# Patient Record
Sex: Female | Born: 1960 | Race: White | Hispanic: No | Marital: Married | Smoking: Never smoker
Health system: Southern US, Community
[De-identification: ages and names within clinical notes are randomized; demographics above are authoritative.]

## PROBLEM LIST (undated history)

## (undated) DIAGNOSIS — I10 Essential (primary) hypertension: Secondary | ICD-10-CM

## (undated) DIAGNOSIS — T481X5A Adverse effect of skeletal muscle relaxants [neuromuscular blocking agents], initial encounter: Secondary | ICD-10-CM

## (undated) DIAGNOSIS — F419 Anxiety disorder, unspecified: Secondary | ICD-10-CM

## (undated) DIAGNOSIS — F40240 Claustrophobia: Secondary | ICD-10-CM

## (undated) DIAGNOSIS — Z8489 Family history of other specified conditions: Secondary | ICD-10-CM

## (undated) DIAGNOSIS — Z872 Personal history of diseases of the skin and subcutaneous tissue: Secondary | ICD-10-CM

## (undated) DIAGNOSIS — T8859XA Other complications of anesthesia, initial encounter: Secondary | ICD-10-CM

## (undated) DIAGNOSIS — M8580 Other specified disorders of bone density and structure, unspecified site: Secondary | ICD-10-CM

## (undated) DIAGNOSIS — M1990 Unspecified osteoarthritis, unspecified site: Secondary | ICD-10-CM

## (undated) DIAGNOSIS — T4145XA Adverse effect of unspecified anesthetic, initial encounter: Secondary | ICD-10-CM

## (undated) DIAGNOSIS — N6092 Unspecified benign mammary dysplasia of left breast: Secondary | ICD-10-CM

## (undated) HISTORY — PX: LASIK: SHX215

## (undated) HISTORY — PX: ENDOMETRIAL ABLATION: SHX621

## (undated) HISTORY — PX: BREAST BIOPSY: SHX20

## (undated) HISTORY — PX: WISDOM TOOTH EXTRACTION: SHX21

## (undated) HISTORY — PX: CATARACT EXTRACTION: SUR2

## (undated) HISTORY — PX: OTHER SURGICAL HISTORY: SHX169

---

## 1993-09-12 HISTORY — PX: RHINOPLASTY: SUR1284

## 2005-02-22 ENCOUNTER — Ambulatory Visit: Payer: Self-pay | Admitting: Unknown Physician Specialty

## 2005-03-07 ENCOUNTER — Ambulatory Visit: Payer: Self-pay | Admitting: Unknown Physician Specialty

## 2005-08-19 ENCOUNTER — Ambulatory Visit: Payer: Self-pay | Admitting: Unknown Physician Specialty

## 2006-03-22 ENCOUNTER — Ambulatory Visit: Payer: Self-pay | Admitting: Unknown Physician Specialty

## 2007-04-16 ENCOUNTER — Ambulatory Visit: Payer: Self-pay | Admitting: General Surgery

## 2007-04-18 ENCOUNTER — Ambulatory Visit: Payer: Self-pay | Admitting: General Surgery

## 2008-04-21 ENCOUNTER — Ambulatory Visit: Payer: Self-pay | Admitting: Unknown Physician Specialty

## 2009-04-22 ENCOUNTER — Ambulatory Visit: Payer: Self-pay | Admitting: Unknown Physician Specialty

## 2009-05-26 ENCOUNTER — Ambulatory Visit: Payer: Self-pay | Admitting: Unknown Physician Specialty

## 2010-01-05 ENCOUNTER — Ambulatory Visit: Payer: Self-pay | Admitting: General Surgery

## 2010-04-30 ENCOUNTER — Ambulatory Visit: Payer: Self-pay | Admitting: Unknown Physician Specialty

## 2010-05-03 LAB — PATHOLOGY REPORT

## 2011-01-05 ENCOUNTER — Ambulatory Visit: Payer: Self-pay | Admitting: Unknown Physician Specialty

## 2011-07-13 ENCOUNTER — Ambulatory Visit: Payer: Self-pay | Admitting: Unknown Physician Specialty

## 2011-07-15 LAB — PATHOLOGY REPORT

## 2014-08-01 ENCOUNTER — Ambulatory Visit: Payer: Self-pay | Admitting: Unknown Physician Specialty

## 2014-08-01 LAB — HM COLONOSCOPY

## 2015-05-27 ENCOUNTER — Other Ambulatory Visit: Payer: Self-pay | Admitting: Family Medicine

## 2015-06-22 ENCOUNTER — Other Ambulatory Visit: Payer: Self-pay | Admitting: Family Medicine

## 2015-08-07 ENCOUNTER — Telehealth: Payer: Self-pay

## 2015-08-07 DIAGNOSIS — J0140 Acute pansinusitis, unspecified: Secondary | ICD-10-CM

## 2015-08-07 MED ORDER — AZITHROMYCIN 250 MG PO TABS
ORAL_TABLET | ORAL | Status: DC
Start: 2015-08-07 — End: 2017-06-23

## 2015-08-07 NOTE — Telephone Encounter (Signed)
zpak sent in to [facility]

## 2015-08-07 NOTE — Telephone Encounter (Signed)
Pt advised on her voicemail-aa 

## 2015-08-07 NOTE — Telephone Encounter (Signed)
Patient calling and states she is having congestion, cough, blowing out and coughing up yellow phlegm, sinus pain/pressure around her eyes, no fever or chills. Can she get something called in? She is going out of town next Thursday. No appts left today. Thank you-aa

## 2015-10-12 ENCOUNTER — Other Ambulatory Visit: Payer: Self-pay | Admitting: Family Medicine

## 2015-10-12 DIAGNOSIS — B001 Herpesviral vesicular dermatitis: Secondary | ICD-10-CM

## 2015-10-12 DIAGNOSIS — J309 Allergic rhinitis, unspecified: Secondary | ICD-10-CM

## 2015-10-12 MED ORDER — LEVOCETIRIZINE DIHYDROCHLORIDE 5 MG PO TABS: 5.0000 mg | ORAL_TABLET | Freq: Every day | ORAL | Status: DC

## 2015-10-12 MED ORDER — VALACYCLOVIR HCL 1 G PO TABS: ORAL_TABLET | ORAL | Status: DC

## 2016-04-27 ENCOUNTER — Other Ambulatory Visit: Payer: Self-pay | Admitting: Obstetrics and Gynecology

## 2016-04-27 DIAGNOSIS — R928 Other abnormal and inconclusive findings on diagnostic imaging of breast: Secondary | ICD-10-CM

## 2016-04-29 ENCOUNTER — Ambulatory Visit
Admission: RE | Admit: 2016-04-29 | Discharge: 2016-04-29 | Disposition: A | Discharge status: Home / Self Care | Source: Ambulatory Visit | Attending: Obstetrics and Gynecology | Admitting: Obstetrics and Gynecology

## 2016-04-29 DIAGNOSIS — R928 Other abnormal and inconclusive findings on diagnostic imaging of breast: Secondary | ICD-10-CM

## 2016-06-06 ENCOUNTER — Other Ambulatory Visit: Payer: Self-pay | Admitting: Family Medicine

## 2016-07-19 ENCOUNTER — Other Ambulatory Visit: Payer: Self-pay | Admitting: Family Medicine

## 2016-07-19 DIAGNOSIS — B001 Herpesviral vesicular dermatitis: Secondary | ICD-10-CM

## 2016-09-02 ENCOUNTER — Other Ambulatory Visit: Payer: Self-pay | Admitting: Family Medicine

## 2016-09-21 HISTORY — PX: OTHER SURGICAL HISTORY: SHX169

## 2016-10-11 ENCOUNTER — Other Ambulatory Visit: Payer: Self-pay | Admitting: Family Medicine

## 2016-10-11 DIAGNOSIS — J309 Allergic rhinitis, unspecified: Secondary | ICD-10-CM

## 2016-11-17 ENCOUNTER — Other Ambulatory Visit: Payer: Self-pay | Admitting: Family Medicine

## 2016-11-17 ENCOUNTER — Telehealth: Payer: Self-pay

## 2016-11-17 MED ORDER — CETIRIZINE HCL 10 MG PO TABS: 10.0000 mg | ORAL_TABLET | Freq: Every day | ORAL | 3 refills | Status: DC

## 2016-11-17 NOTE — Telephone Encounter (Signed)
Pt called because her pharmacy informed her that they cannot prescribe her xyzal because it is now OTC. Pt is requesting a prescription for a new antihistamine, and was informed that cetirizine can be prescribed and is covered by her insurance. [facility] [address]. Allene Dillon.Beyonca Wisz Drozdowski, CMA

## 2016-11-17 NOTE — Telephone Encounter (Signed)
[facility]

## 2016-11-28 ENCOUNTER — Other Ambulatory Visit: Payer: Self-pay | Admitting: Family Medicine

## 2017-05-09 ENCOUNTER — Other Ambulatory Visit: Payer: Self-pay | Admitting: Obstetrics and Gynecology

## 2017-05-09 DIAGNOSIS — R928 Other abnormal and inconclusive findings on diagnostic imaging of breast: Secondary | ICD-10-CM

## 2017-06-02 ENCOUNTER — Ambulatory Visit
Admission: RE | Admit: 2017-06-02 | Discharge: 2017-06-02 | Disposition: A | Discharge status: Home / Self Care | Source: Ambulatory Visit | Attending: Obstetrics and Gynecology | Admitting: Obstetrics and Gynecology

## 2017-06-02 DIAGNOSIS — R928 Other abnormal and inconclusive findings on diagnostic imaging of breast: Secondary | ICD-10-CM

## 2017-06-23 ENCOUNTER — Ambulatory Visit (INDEPENDENT_AMBULATORY_CARE_PROVIDER_SITE_OTHER): Admitting: Family Medicine

## 2017-06-23 ENCOUNTER — Encounter: Payer: Self-pay | Admitting: Family Medicine

## 2017-06-23 VITALS — BP 112/80 | HR 70 | Temp 97.8°F | Resp 16 | Wt 141.0 lb

## 2017-06-23 DIAGNOSIS — L719 Rosacea, unspecified: Secondary | ICD-10-CM | POA: Insufficient documentation

## 2017-06-23 DIAGNOSIS — I10 Essential (primary) hypertension: Secondary | ICD-10-CM | POA: Diagnosis not present

## 2017-06-23 DIAGNOSIS — Z13220 Encounter for screening for lipoid disorders: Secondary | ICD-10-CM

## 2017-06-23 DIAGNOSIS — J301 Allergic rhinitis due to pollen: Secondary | ICD-10-CM | POA: Insufficient documentation

## 2017-06-23 LAB — LIPID PANEL
CHOL/HDL RATIO: 2.4 (calc) (ref <5.0)
CHOLESTEROL: 170 mg/dL (ref <200)
HDL: 71 mg/dL (ref >50)
LDL CHOLESTEROL (CALC): 83 mg/dL
NON-HDL CHOLESTEROL (CALC): 99 mg/dL (ref <130)
TRIGLYCERIDES: 82 mg/dL (ref <150)

## 2017-06-23 LAB — COMPLETE METABOLIC PANEL WITH GFR
AG Ratio: 1.7 (calc) (ref 1.0–2.5)
ALT: 11 U/L (ref 6–29)
AST: 17 U/L (ref 10–35)
Albumin: 4.3 g/dL (ref 3.6–5.1)
Alkaline phosphatase (APISO): 53 U/L (ref 33–130)
BUN: 15 mg/dL (ref 7–25)
CALCIUM: 9.2 mg/dL (ref 8.6–10.4)
CO2: 32 mmol/L (ref 20–32)
CREATININE: 0.73 mg/dL (ref 0.50–1.05)
Chloride: 100 mmol/L (ref 98–110)
GFR, EST NON AFRICAN AMERICAN: 93 mL/min/{1.73_m2} (ref >=60)
GFR, Est African American: 108 mL/min/{1.73_m2} (ref >=60)
GLOBULIN: 2.6 g/dL (ref 1.9–3.7)
Glucose, Bld: 87 mg/dL (ref 65–99)
Potassium: 4 mmol/L (ref 3.5–5.3)
SODIUM: 138 mmol/L (ref 135–146)
Total Bilirubin: 0.7 mg/dL (ref 0.2–1.2)
Total Protein: 6.9 g/dL (ref 6.1–8.1)

## 2017-06-23 NOTE — Progress Notes (Signed)
Subjective:     Patient ID: Jill Robbins, female   DOB: [birth year], 56 y.o.   MRN: [id number]  HPI  Chief Complaint  Patient presents with  . Hypertension  . Allergic Rhinitis   States she has been keeping up with her health maintenance and her gyn has been refilling her bp medication. Receiving laser treatments for rosacea at [facility]. Wishes rx for Shingrix vaccine.   Review of Systems  Respiratory: Negative for chest tightness and shortness of breath.   Cardiovascular: Negative for chest pain and palpitations.  Neurological: Negative for dizziness.       Objective:   Physical Exam  Constitutional: She appears well-developed and well-nourished. No distress.  Cardiovascular: Normal rate and regular rhythm.   Pulmonary/Chest: Breath sounds normal.  Musculoskeletal: She exhibits no edema (of lower extremities).       Assessment:    1. Essential hypertension: continue dyazide  - COMPLETE METABOLIC PANEL WITH GFR  2. Allergic rhinitis due to pollen, unspecified seasonality: continue cetirizine  3. Screening for cholesterol level - Lipid panel    Plan:    Rx for Shingrix vaccine. Further f/u pending lab work.

## 2017-06-23 NOTE — Patient Instructions (Signed)
We will call you with the lab results. 

## 2017-06-24 ENCOUNTER — Telehealth: Payer: Self-pay

## 2017-06-24 NOTE — Telephone Encounter (Signed)
-----   Message from Anola Gurney, [place] sent at 06/23/2017  4:31 PM EDT ----- Labs are excellent/ Please provide copy for the patient

## 2017-06-24 NOTE — Telephone Encounter (Signed)
[id number] 

## 2017-06-27 NOTE — Telephone Encounter (Signed)
Pt advised-Josi Roediger V Priseis Cratty, RMA  

## 2017-07-07 ENCOUNTER — Encounter: Payer: Self-pay | Admitting: Family Medicine

## 2017-07-17 ENCOUNTER — Other Ambulatory Visit: Payer: Self-pay | Admitting: Family Medicine

## 2017-07-17 DIAGNOSIS — B001 Herpesviral vesicular dermatitis: Secondary | ICD-10-CM

## 2017-07-18 ENCOUNTER — Encounter: Payer: Self-pay | Admitting: Family Medicine

## 2017-07-18 DIAGNOSIS — B001 Herpesviral vesicular dermatitis: Secondary | ICD-10-CM | POA: Insufficient documentation

## 2017-08-25 ENCOUNTER — Encounter: Payer: Self-pay | Admitting: Family Medicine

## 2017-08-25 ENCOUNTER — Ambulatory Visit (INDEPENDENT_AMBULATORY_CARE_PROVIDER_SITE_OTHER): Admitting: Family Medicine

## 2017-08-25 VITALS — BP 132/84 | HR 80 | Temp 98.2°F | Resp 16 | Wt 143.4 lb

## 2017-08-25 DIAGNOSIS — S6991XA Unspecified injury of right wrist, hand and finger(s), initial encounter: Secondary | ICD-10-CM | POA: Diagnosis not present

## 2017-08-25 DIAGNOSIS — Z23 Encounter for immunization: Secondary | ICD-10-CM

## 2017-08-25 NOTE — Progress Notes (Signed)
Subjective:     Patient ID: Jill Robbins, female   DOB: 07/13/1961, 56 y.o.   MRN: [id number] Chief Complaint  Patient presents with  . Finger Injury    Patient reports that two days ago she accidently slammed car door shut on her right thumb, patient states that she has been icing thumb and had taken Ibuprofen 800mg  for relief but is still has tingling and throbbing in finger.    HPI   Review of Systems     Objective:   Physical Exam  Constitutional: She appears well-developed and well-nourished. She appears distressed (due to pain).  Skin:  Right thumb with mild swelling. Thumbnail with sub-ungual hematoma. Attempt at burr hole with 18 gauge needle not tolerated by the patient.  Psychiatric:  anxious       Assessment:    1. Injury to fingernail, right, initial encounter: subungual hematoma  2. Need for shingles vaccine - Varicella-zoster vaccine IM    Plan:   Continue icing, ibuprofen, and elevation. Discussed that it will be at least a week for it to start feeling better. Monitor for nail lysis.

## 2017-08-25 NOTE — Patient Instructions (Signed)
Continue icing and taking ibuprofen. It takes a week or so for the swelling and pain to dissipate. Expect it to take 3 months for the nail to grow out.

## 2017-09-07 NOTE — Patient Instructions (Addendum)
Your procedure is scheduled on: Friday September 22, 2017 at 7:30 am  Enter through the Main Entrance of [facility] at: 6:00 am  Pick up the phone at the desk and dial [phone].  Call this number if you have problems the morning of surgery: [phone].  Remember: Do NOT eat food or drink any liquids after: Midnight Thursday January 10  Take these medicines the morning of surgery with a SIP OF WATER: none  STOP ALL VITAMINS AND SUPPLEMENTS 1 WEEK PRIOR TO SURGERY  DO NOT SMOKE DAY OF SURGERY  Do NOT wear jewelry (body piercing), metal hair clips/bobby pins, make-up, or nail polish. Do NOT wear lotions, powders, or perfumes.  You may wear deoderant. Do NOT shave for 48 hours prior to surgery. Do NOT bring valuables to the hospital. Contacts, dentures, or bridgework may not be worn into surgery.  Have a responsible adult drive you home and stay with you for 24 hours after your procedure

## 2017-09-13 ENCOUNTER — Encounter (HOSPITAL_COMMUNITY): Payer: Self-pay | Admitting: Obstetrics and Gynecology

## 2017-09-13 NOTE — H&P (Addendum)
Jill Robbins is an 57 y.o. female. Presenting with labial hypertrophy for labial reduction  Pertinent Gynecological History: Menses: post-menopausal Bleeding: N/A Contraception: vasectomy DES exposure: unknown Blood transfusions: none Sexually transmitted diseases: no past history Previous GYN Procedures: None  Last mammogram: abnormal: BIRADs3, 6 mo fu is planned Date: 2018 Last pap: normal Date: 2017 OB History: G0, P0   Menstrual History: No LMP recorded.    Past Medical History:  Diagnosis Date  . HTN (hypertension)   . Osteopenia     Past Surgical History:  Procedure Laterality Date  . ENDOMETRIAL ABLATION      No family history on file.  Social History:  reports that  has never smoked. she has never used smokeless tobacco. She reports that she drinks alcohol. She reports that she does not use drugs.  Allergies:  Allergies  Allergen Reactions  . Succinylcholine Other (See Comments)    Difficultly waking up  . Sulfa Antibiotics Rash  . Other     Mellon - lips swelling, ulcers inside the mouth  . Peanut-Containing Drug Products Itching    No medications prior to admission.    ROS  There were no vitals taken for this visit. Physical Exam  Gen: well appearing, NAD CV: Reg rate Pulm: NWOB Abd: soft, nondistended, nontender, no masses GYN: uterus WNL, no adnexa ttp/CMT, labial hypertrophy b/l with sig redundancy Ext: No edema b/l   No results found for this or any previous visit (from the past 24 hour(s)).  No results found.  Assessment/Plan: 57yo G0 presenting with labial hypertrophy for labial reduction. It is bothersome to the patient and would like it surgically corrected. Risks discussed including infection, bleeding, damage to the labia and surrounding structures, poor cosmesis, inability to resolve symptoms, and the need for additional procedures. Informed consent was given and patient signed consent.    Jill Robbins 09/13/2017, 2:02 PM    No updates. Again reviewed risks. Patient gave informed consent. Jill Robbins Latalia Etzler MD

## 2017-09-15 ENCOUNTER — Encounter (HOSPITAL_COMMUNITY): Payer: Self-pay

## 2017-09-15 ENCOUNTER — Encounter (HOSPITAL_COMMUNITY)
Admission: RE | Admit: 2017-09-15 | Discharge: 2017-09-15 | Disposition: A | Discharge status: Home / Self Care | Source: Ambulatory Visit | Attending: Obstetrics and Gynecology | Admitting: Obstetrics and Gynecology

## 2017-09-15 ENCOUNTER — Other Ambulatory Visit: Payer: Self-pay

## 2017-09-15 DIAGNOSIS — Z01812 Encounter for preprocedural laboratory examination: Secondary | ICD-10-CM | POA: Diagnosis present

## 2017-09-15 HISTORY — DX: Family history of other specified conditions: Z84.89

## 2017-09-15 HISTORY — DX: Other complications of anesthesia, initial encounter: T88.59XA

## 2017-09-15 HISTORY — DX: Adverse effect of unspecified anesthetic, initial encounter: T41.45XA

## 2017-09-15 LAB — CBC
HCT: 42 % (ref 36.0–46.0)
Hemoglobin: 14.5 g/dL (ref 12.0–15.0)
MCH: 32.2 pg (ref 26.0–34.0)
MCHC: 34.5 g/dL (ref 30.0–36.0)
MCV: 93.1 fL (ref 78.0–100.0)
PLATELETS: 290 10*3/uL (ref 150–400)
RBC: 4.51 MIL/uL (ref 3.87–5.11)
RDW: 12.5 % (ref 11.5–15.5)
WBC: 6.2 10*3/uL (ref 4.0–10.5)

## 2017-09-15 LAB — BASIC METABOLIC PANEL
Anion gap: 11 (ref 5–15)
BUN: 17 mg/dL (ref 6–20)
CO2: 28 mmol/L (ref 22–32)
Calcium: 9.8 mg/dL (ref 8.9–10.3)
Chloride: 97 mmol/L — ABNORMAL LOW (ref 101–111)
Creatinine, Ser: 0.67 mg/dL (ref 0.44–1.00)
GFR calc Af Amer: >60 mL/min (ref >60)
GFR calc non Af Amer: >60 mL/min (ref >60)
GLUCOSE: 86 mg/dL (ref 65–99)
POTASSIUM: 3.7 mmol/L (ref 3.5–5.1)
Sodium: 136 mmol/L (ref 135–145)

## 2017-09-15 LAB — TYPE AND SCREEN
ABO/RH(D): O POS
Antibody Screen: NEGATIVE

## 2017-09-15 LAB — ABO/RH: ABO/RH(D): O POS

## 2017-09-21 NOTE — Anesthesia Preprocedure Evaluation (Addendum)
Anesthesia Evaluation  Patient identified by MRN, date of birth, ID band Patient awake    Reviewed: Allergy & Precautions, NPO status , Patient's Chart, lab work & pertinent test results  History of Anesthesia Complications (+) PSEUDOCHOLINESTERASE DEFICIENCY, Family history of anesthesia reaction and history of anesthetic complications  Airway Mallampati: II  TM Distance: >3 FB Neck ROM: Full    Dental  (+) Dental Advisory Given   Pulmonary neg pulmonary ROS,    Pulmonary exam normal breath sounds clear to auscultation       Cardiovascular hypertension, Pt. on medications Normal cardiovascular exam Rhythm:Regular Rate:Normal     Neuro/Psych negative neurological ROS  negative psych ROS   GI/Hepatic negative GI ROS, Neg liver ROS,   Endo/Other  negative endocrine ROS  Renal/GU negative Renal ROS  negative genitourinary   Musculoskeletal negative musculoskeletal ROS (+)   Abdominal   Peds  Hematology negative hematology ROS (+)   Anesthesia Other Findings   Reproductive/Obstetrics                            Anesthesia Physical Anesthesia Plan  ASA: II  Anesthesia Plan: General   Post-op Pain Management:    Induction: Intravenous  PONV Risk Score and Plan: Propofol infusion and Treatment may vary due to age or medical condition  Airway Management Planned: LMA  Additional Equipment: None  Intra-op Plan:   Post-operative Plan:   Informed Consent: I have reviewed the patients History and Physical, chart, labs and discussed the procedure including the risks, benefits and alternatives for the proposed anesthesia with the patient or authorized representative who has indicated his/her understanding and acceptance.   Dental advisory given  Plan Discussed with: CRNA  Anesthesia Plan Comments:        Anesthesia Quick Evaluation

## 2017-09-22 ENCOUNTER — Encounter (HOSPITAL_COMMUNITY)
Admission: RE | Disposition: A | Discharge status: Home / Self Care | Payer: Self-pay | Source: Ambulatory Visit | Attending: Obstetrics and Gynecology

## 2017-09-22 ENCOUNTER — Ambulatory Visit (HOSPITAL_COMMUNITY): Admitting: Anesthesiology

## 2017-09-22 ENCOUNTER — Ambulatory Visit (HOSPITAL_COMMUNITY)
Admission: RE | Admit: 2017-09-22 | Discharge: 2017-09-22 | Disposition: A | Discharge status: Home / Self Care | Source: Ambulatory Visit | Attending: Obstetrics and Gynecology | Admitting: Obstetrics and Gynecology

## 2017-09-22 ENCOUNTER — Encounter (HOSPITAL_COMMUNITY): Payer: Self-pay

## 2017-09-22 DIAGNOSIS — I10 Essential (primary) hypertension: Secondary | ICD-10-CM | POA: Diagnosis not present

## 2017-09-22 DIAGNOSIS — N9069 Other specified hypertrophy of vulva: Secondary | ICD-10-CM | POA: Diagnosis present

## 2017-09-22 DIAGNOSIS — Z91010 Allergy to peanuts: Secondary | ICD-10-CM | POA: Diagnosis not present

## 2017-09-22 DIAGNOSIS — M8580 Other specified disorders of bone density and structure, unspecified site: Secondary | ICD-10-CM | POA: Diagnosis not present

## 2017-09-22 DIAGNOSIS — E8809 Other disorders of plasma-protein metabolism, not elsewhere classified: Secondary | ICD-10-CM | POA: Insufficient documentation

## 2017-09-22 DIAGNOSIS — Z79899 Other long term (current) drug therapy: Secondary | ICD-10-CM | POA: Diagnosis not present

## 2017-09-22 DIAGNOSIS — Z882 Allergy status to sulfonamides status: Secondary | ICD-10-CM | POA: Diagnosis not present

## 2017-09-22 DIAGNOSIS — Z888 Allergy status to other drugs, medicaments and biological substances status: Secondary | ICD-10-CM | POA: Insufficient documentation

## 2017-09-22 HISTORY — DX: Other specified disorders of bone density and structure, unspecified site: M85.80

## 2017-09-22 HISTORY — PX: LABIOPLASTY: SHX1900

## 2017-09-22 HISTORY — DX: Essential (primary) hypertension: I10

## 2017-09-22 LAB — PREGNANCY, URINE: PREG TEST UR: NEGATIVE

## 2017-09-22 SURGERY — LABIAPLASTY, VULVA
Anesthesia: General | Laterality: Bilateral

## 2017-09-22 MED ORDER — ONDANSETRON HCL 4 MG/2ML IJ SOLN
INTRAMUSCULAR | Status: DC | PRN
  Administered 2017-09-22: 4 mg via INTRAVENOUS

## 2017-09-22 MED ORDER — SCOPOLAMINE 1 MG/3DAYS TD PT72
MEDICATED_PATCH | TRANSDERMAL | Status: AC
  Administered 2017-09-22: 1.5 mg via TRANSDERMAL
  Filled 2017-09-22: qty 1

## 2017-09-22 MED ORDER — LIDOCAINE HCL 1 % IJ SOLN
INTRAMUSCULAR | Status: AC
  Filled 2017-09-22: qty 20

## 2017-09-22 MED ORDER — MIDAZOLAM HCL 2 MG/2ML IJ SOLN
INTRAMUSCULAR | Status: AC
  Filled 2017-09-22: qty 2

## 2017-09-22 MED ORDER — MIDAZOLAM HCL 2 MG/2ML IJ SOLN
INTRAMUSCULAR | Status: DC | PRN
  Administered 2017-09-22: 2 mg via INTRAVENOUS

## 2017-09-22 MED ORDER — FENTANYL CITRATE (PF) 100 MCG/2ML IJ SOLN
INTRAMUSCULAR | Status: AC
  Filled 2017-09-22: qty 2

## 2017-09-22 MED ORDER — OXYCODONE HCL 5 MG PO TABS: 5.0000 mg | ORAL_TABLET | Freq: Once | ORAL | Status: DC | PRN

## 2017-09-22 MED ORDER — PROPOFOL 10 MG/ML IV BOLUS
INTRAVENOUS | Status: AC
  Filled 2017-09-22: qty 40

## 2017-09-22 MED ORDER — LACTATED RINGERS IV SOLN
INTRAVENOUS | Status: DC
  Administered 2017-09-22: 07:00:00 via INTRAVENOUS

## 2017-09-22 MED ORDER — GLYCOPYRROLATE 0.2 MG/ML IJ SOLN
INTRAMUSCULAR | Status: DC | PRN
  Administered 2017-09-22: 0.2 mg via INTRAVENOUS

## 2017-09-22 MED ORDER — KETOROLAC TROMETHAMINE 30 MG/ML IJ SOLN
30.0000 mg | Freq: Once | INTRAMUSCULAR | Status: DC | PRN
  Administered 2017-09-22: 30 mg via INTRAVENOUS

## 2017-09-22 MED ORDER — DEXAMETHASONE SODIUM PHOSPHATE 4 MG/ML IJ SOLN
INTRAMUSCULAR | Status: AC
  Filled 2017-09-22: qty 1

## 2017-09-22 MED ORDER — FENTANYL CITRATE (PF) 100 MCG/2ML IJ SOLN
25.0000 ug | INTRAMUSCULAR | Status: DC | PRN
  Administered 2017-09-22: 50 ug via INTRAVENOUS

## 2017-09-22 MED ORDER — LIDOCAINE HCL (CARDIAC) 20 MG/ML IV SOLN
INTRAVENOUS | Status: DC | PRN
  Administered 2017-09-22: 100 mg via INTRAVENOUS

## 2017-09-22 MED ORDER — LIDOCAINE HCL (CARDIAC) 20 MG/ML IV SOLN
INTRAVENOUS | Status: AC
  Filled 2017-09-22: qty 5

## 2017-09-22 MED ORDER — LIDOCAINE-EPINEPHRINE 1 %-1:100000 IJ SOLN
INTRAMUSCULAR | Status: AC
  Filled 2017-09-22: qty 1

## 2017-09-22 MED ORDER — OXYCODONE HCL 5 MG/5ML PO SOLN: 5.0000 mg | Freq: Once | ORAL | Status: DC | PRN

## 2017-09-22 MED ORDER — PROMETHAZINE HCL 25 MG/ML IJ SOLN: 6.2500 mg | INTRAMUSCULAR | Status: DC | PRN

## 2017-09-22 MED ORDER — DEXAMETHASONE SODIUM PHOSPHATE 4 MG/ML IJ SOLN
INTRAMUSCULAR | Status: DC | PRN
  Administered 2017-09-22: 10 mg via INTRAVENOUS

## 2017-09-22 MED ORDER — SCOPOLAMINE 1 MG/3DAYS TD PT72
1.0000 | MEDICATED_PATCH | Freq: Once | TRANSDERMAL | Status: DC
  Administered 2017-09-22: 1.5 mg via TRANSDERMAL

## 2017-09-22 MED ORDER — BENZOCAINE-MENTHOL 20-0.5 % EX AERO
1.0000 "application " | INHALATION_SPRAY | Freq: Once | CUTANEOUS | Status: DC
  Filled 2017-09-22: qty 56

## 2017-09-22 MED ORDER — LIDOCAINE HCL 1 % IJ SOLN
INTRAMUSCULAR | Status: DC | PRN
  Administered 2017-09-22: 8 mL

## 2017-09-22 MED ORDER — ONDANSETRON HCL 4 MG/2ML IJ SOLN
INTRAMUSCULAR | Status: AC
  Filled 2017-09-22: qty 2

## 2017-09-22 MED ORDER — PROPOFOL 10 MG/ML IV BOLUS
INTRAVENOUS | Status: DC | PRN
  Administered 2017-09-22: 200 mg via INTRAVENOUS

## 2017-09-22 MED ORDER — FENTANYL CITRATE (PF) 100 MCG/2ML IJ SOLN
INTRAMUSCULAR | Status: DC | PRN
  Administered 2017-09-22: 50 ug via INTRAVENOUS
  Administered 2017-09-22 (×2): 25 ug via INTRAVENOUS

## 2017-09-22 MED ORDER — KETOROLAC TROMETHAMINE 30 MG/ML IJ SOLN
INTRAMUSCULAR | Status: AC
  Administered 2017-09-22: 30 mg via INTRAVENOUS
  Filled 2017-09-22: qty 1

## 2017-09-22 SURGICAL SUPPLY — 17 items
BLADE SURG 15 STRL LF C SS BP (BLADE) ×1 IMPLANT
BLADE SURG 15 STRL SS (BLADE) ×1
ELECT REM PT RETURN 9FT ADLT (ELECTROSURGICAL) ×2
ELECTRODE REM PT RTRN 9FT ADLT (ELECTROSURGICAL) ×1 IMPLANT
GLOVE BIO SURGEON STRL SZ 6.5 (GLOVE) ×2 IMPLANT
GLOVE BIOGEL PI IND STRL 7.0 (GLOVE) ×2 IMPLANT
GLOVE BIOGEL PI INDICATOR 7.0 (GLOVE) ×2
GLOVE ECLIPSE 6.5 STRL STRAW (GLOVE) ×2 IMPLANT
GOWN STRL REUS W/TWL LRG LVL3 (GOWN DISPOSABLE) ×4 IMPLANT
NEEDLE HYPO 22GX1.5 SAFETY (NEEDLE) ×2 IMPLANT
PACK VAGINAL MINOR WOMEN LF (CUSTOM PROCEDURE TRAY) ×2 IMPLANT
PAD OB MATERNITY 4.3X12.25 (PERSONAL CARE ITEMS) ×2 IMPLANT
PENCIL BUTTON HOLSTER BLD 10FT (ELECTRODE) IMPLANT
SUT CHROMIC 3 0 SH 27 (SUTURE) ×4 IMPLANT
SUT VIC AB 3-0 SH 27 (SUTURE) ×2
SUT VIC AB 3-0 SH 27X BRD (SUTURE) ×2 IMPLANT
TOWEL OR 17X24 6PK STRL BLUE (TOWEL DISPOSABLE) ×4 IMPLANT

## 2017-09-22 NOTE — Brief Op Note (Signed)
09/22/2017  8:29 AM  PATIENT:  Gregery NaLori B Ermis  57 y.o. female  PRE-OPERATIVE DIAGNOSIS:  LABIAL HYPERTROPHY  POST-OPERATIVE DIAGNOSIS:  LABIAL HYPERTROPHY  PROCEDURE:  Procedure(s): LABIAL reduction (Bilateral)  SURGEON:  Surgeon(s) and Role:    * Blayke Cordrey, Madelaine EtienneElise Jennifer, MD - Primary  PHYSICIAN ASSISTANT:   ASSISTANTS: none   ANESTHESIA:   IV sedation  EBL:  10 mL   BLOOD ADMINISTERED:none  DRAINS: none   LOCAL MEDICATIONS USED:  LIDOCAINE  and Amount: 10 ml  SPECIMEN:  No Specimen  DISPOSITION OF SPECIMEN:  N/A  COUNTS:  YES  TOURNIQUET:  * No tourniquets in log *  DICTATION: .Note written in EPIC  PLAN OF CARE: Discharge to home after PACU  PATIENT DISPOSITION:  PACU - hemodynamically stable.   Delay start of Pharmacological VTE agent (>24hrs) due to surgical blood loss or risk of bleeding: not applicable

## 2017-09-22 NOTE — OR Nursing (Signed)
09/22/2016-Specimen not sent per physician.

## 2017-09-22 NOTE — Anesthesia Postprocedure Evaluation (Signed)
Anesthesia Post Note  Patient: Jill NaLori B Robbins  Procedure(s) Performed: LABIAL reduction (Bilateral )     Patient location during evaluation: PACU Anesthesia Type: General Level of consciousness: awake and alert Pain management: pain level controlled Vital Signs Assessment: post-procedure vital signs reviewed and stable Respiratory status: spontaneous breathing, nonlabored ventilation and respiratory function stable Cardiovascular status: blood pressure returned to baseline and stable Postop Assessment: no apparent nausea or vomiting Anesthetic complications: no    Last Vitals:  Vitals:   09/22/17 0900 09/22/17 0915  BP: 118/72 118/86  Pulse: 64 65  Resp: 18 16  Temp:    SpO2: 100% 100%    Last Pain:  Vitals:   09/22/17 0900  TempSrc:   PainSc: Asleep   Pain Goal:                 Beryle Lathehomas E Brock

## 2017-09-22 NOTE — Transfer of Care (Signed)
Immediate Anesthesia Transfer of Care Note  Patient: Jill Robbins  Procedure(s) Performed: LABIAL reduction (Bilateral )  Patient Location: PACU  Anesthesia Type:General  Level of Consciousness: awake, alert  and oriented  Airway & Oxygen Therapy: Patient Spontanous Breathing and Patient connected to nasal cannula oxygen  Post-op Assessment: Report given to RN and Post -op Vital signs reviewed and stable  Post vital signs: Reviewed and stable  Last Vitals:  Vitals:   09/22/17 0607  BP: (!) 129/96  Pulse: 74  Resp: 16  Temp: 36.6 C  SpO2: 100%    Last Pain:  Vitals:   09/22/17 0607  TempSrc: Oral         Complications: No apparent anesthesia complications

## 2017-09-22 NOTE — Discharge Instructions (Signed)

## 2017-09-22 NOTE — Op Note (Signed)
PREOPERATIVE DIAGNOSES: 1.  Labial hypertrophy   POSTOPERATIVE DIAGNOSES: Same  PROCEDURE PERFORMED: Labial reduction  SURGEON: Dr. Belva AgeeElise Prestina Raigoza  ANESTHESIA: IV sedation  ESTIMATED BLOOD LOSS: minimal  COMPLICATIONS: None  TUBES: None.  DRAINS: None  PATHOLOGY: None  FINDINGS: On exam, under anesthesia, bilateral labia minora hypertrophy  Procedure: The patient was taken to the operating room where she was properly prepped and draped in sterile manner under anesthesia.The risks, benefits, indications and alternatives of the procedure were discussed with the patient and informed consent was signed. The patient was then taken to the procedure room and prepped and draped in the usual sterile fashion.   The labia and clitoris were then marked using the marking pen to the patient's specifications.   The perineal area was infiltrated first with the creation of a small bleb followed by infiltration of the labia majora up to the clitoris on the left side.   The same technique was done on the right side.   The labia minora was then infiltrated along the lines of demarcation. They were then clamped using Heaney clamps and the tissue excised. The clamped tissue was then cauterized using a single tip Bovie.  Excellent hemostasis was confirmed.   The clitoral hood was then trimmed using scissors. The exposed tissue of of the labia were re-approximated using 3-0 chromic in two layers.  Excellent hemostasis was noted.   This completed the procedure. The patient tolerated the procedure and was discharged home in stable condition.The sponge and lap counts were correct times 2 at this time. The patient's procedure was terminated. We then awakened her. She was sent to the Recovery Room in good condition.    Belva AgeeElise Coryn Mosso MD

## 2017-09-22 NOTE — Anesthesia Procedure Notes (Signed)
Procedure Name: LMA Insertion Date/Time: 09/22/2017 7:26 AM Performed by: Rica Recordsickelton, Eswin Worrell, CRNA Pre-anesthesia Checklist: Patient identified, Emergency Drugs available, Suction available and Patient being monitored Patient Re-evaluated:Patient Re-evaluated prior to induction Oxygen Delivery Method: Circle system utilized and Simple face mask Preoxygenation: Pre-oxygenation with 100% oxygen Induction Type: IV induction Ventilation: Mask ventilation without difficulty LMA: LMA inserted LMA Size: 4.0 Number of attempts: 1 Dental Injury: Teeth and Oropharynx as per pre-operative assessment

## 2017-09-23 ENCOUNTER — Encounter (HOSPITAL_COMMUNITY): Payer: Self-pay | Admitting: Obstetrics and Gynecology

## 2017-10-06 ENCOUNTER — Other Ambulatory Visit: Payer: Self-pay | Admitting: Obstetrics and Gynecology

## 2017-10-06 DIAGNOSIS — Z139 Encounter for screening, unspecified: Secondary | ICD-10-CM

## 2017-10-06 DIAGNOSIS — N6489 Other specified disorders of breast: Secondary | ICD-10-CM

## 2017-11-03 ENCOUNTER — Ambulatory Visit: Payer: Self-pay | Admitting: Family Medicine

## 2017-11-24 ENCOUNTER — Ambulatory Visit (INDEPENDENT_AMBULATORY_CARE_PROVIDER_SITE_OTHER): Admitting: Family Medicine

## 2017-11-24 DIAGNOSIS — Z23 Encounter for immunization: Secondary | ICD-10-CM | POA: Diagnosis not present

## 2017-12-01 ENCOUNTER — Ambulatory Visit
Admission: RE | Admit: 2017-12-01 | Discharge: 2017-12-01 | Disposition: A | Discharge status: Home / Self Care | Source: Ambulatory Visit | Attending: Obstetrics and Gynecology | Admitting: Obstetrics and Gynecology

## 2017-12-01 DIAGNOSIS — N6489 Other specified disorders of breast: Secondary | ICD-10-CM

## 2017-12-04 ENCOUNTER — Other Ambulatory Visit: Payer: Self-pay | Admitting: Obstetrics and Gynecology

## 2017-12-04 DIAGNOSIS — N6310 Unspecified lump in the right breast, unspecified quadrant: Secondary | ICD-10-CM

## 2017-12-08 ENCOUNTER — Ambulatory Visit
Admission: RE | Admit: 2017-12-08 | Discharge: 2017-12-08 | Disposition: A | Discharge status: Home / Self Care | Source: Ambulatory Visit | Attending: Obstetrics and Gynecology | Admitting: Obstetrics and Gynecology

## 2017-12-08 DIAGNOSIS — N6310 Unspecified lump in the right breast, unspecified quadrant: Secondary | ICD-10-CM

## 2017-12-08 HISTORY — PX: BREAST BIOPSY: SHX20

## 2018-01-03 ENCOUNTER — Other Ambulatory Visit: Payer: Self-pay | Admitting: Family Medicine

## 2018-01-03 MED ORDER — CETIRIZINE HCL 10 MG PO TABS: 10.0000 mg | ORAL_TABLET | Freq: Every day | ORAL | 1 refills | Status: DC

## 2018-02-23 ENCOUNTER — Ambulatory Visit (INDEPENDENT_AMBULATORY_CARE_PROVIDER_SITE_OTHER): Admitting: Family Medicine

## 2018-02-23 ENCOUNTER — Ambulatory Visit
Admission: RE | Admit: 2018-02-23 | Discharge: 2018-02-23 | Disposition: A | Discharge status: Home / Self Care | Source: Ambulatory Visit | Attending: Family Medicine | Admitting: Family Medicine

## 2018-02-23 VITALS — BP 112/76 | HR 76 | Temp 98.9°F | Resp 16 | Wt 138.0 lb

## 2018-02-23 DIAGNOSIS — M79604 Pain in right leg: Secondary | ICD-10-CM | POA: Insufficient documentation

## 2018-02-23 DIAGNOSIS — M79606 Pain in leg, unspecified: Secondary | ICD-10-CM | POA: Diagnosis present

## 2018-02-23 NOTE — Progress Notes (Signed)
  Subjective:     Patient ID: Jill Robbins, female   DOB: 12/30/1960, 57 y.o.   MRN: [id number] Chief Complaint  Patient presents with  . Leg Problem    pt reports that she noticed a knot on her lower right leg and swelling 2 days ago. She reports that she had scleratherapy in April in the are of her right ankle and then had some fire ant bites recently. She has been trying to figure out what may have caused this. She has swelling and had some discoloration of the area last night. She says the area on her leg is painful. Pt has not injuried her leg in anyway. Her mother recent had a bloodclot in her leg and that is pt concern.    HPI States after she mowed this week and did some extended walking noticed swelling and redness about her right,medial ankle. Also felt that lump in her right mid-lower leg that had been present since April was now tender. Has been using ibuprofen and icing for the last two days.  Review of Systems     Objective:   Physical Exam  Constitutional: She appears well-developed and well-nourished. No distress.  Cardiovascular:  Pulses:      Dorsalis pedis pulses are 2+ on the right side.       Posterior tibial pulses are 2+ on the right side.  Skin:  Right lower leg: Medial aspect of her right mid-tibia is an area of induration and tenderness. No overlying erythema. Right medial ankle with mild tenderness. No significant edema noted.       Assessment:    1. Right leg pain: ? Callus ? Soft tissue - DG Tibia/Fibula Right; Future    Plan:    Continue ibuprofen, cold compresses and elevation. Further f/u pending x-[place] report.

## 2018-02-23 NOTE — Patient Instructions (Signed)
Continue with ibuprofen and ice/elevation for right ankle.

## 2018-02-28 ENCOUNTER — Telehealth: Payer: Self-pay

## 2018-02-28 NOTE — Telephone Encounter (Signed)
Patient called office to give you update and to let you know  that redness and swelling has resolved. KW

## 2018-04-06 ENCOUNTER — Other Ambulatory Visit: Payer: Self-pay | Admitting: Family Medicine

## 2018-04-06 NOTE — Telephone Encounter (Signed)
[facility] faxed a refill request for the following medication. Thanks CC  cetirizine (ZYRTEC) 10 MG tablet

## 2018-04-06 NOTE — Telephone Encounter (Signed)
Patient states that prescription was not at pharmacy, I called pharmacy to confirm and they do have prescription available for pick up. Patient has been advised.

## 2018-04-06 NOTE — Telephone Encounter (Signed)
Should have a 90 day refill left from prior rx.

## 2018-04-06 NOTE — Telephone Encounter (Signed)
Please review. KW 

## 2018-04-09 ENCOUNTER — Encounter: Payer: Self-pay | Admitting: Family Medicine

## 2018-04-09 ENCOUNTER — Ambulatory Visit (INDEPENDENT_AMBULATORY_CARE_PROVIDER_SITE_OTHER): Admitting: Family Medicine

## 2018-04-09 VITALS — BP 108/80 | HR 70 | Temp 97.7°F | Resp 15 | Wt 137.8 lb

## 2018-04-09 DIAGNOSIS — J301 Allergic rhinitis due to pollen: Secondary | ICD-10-CM | POA: Diagnosis not present

## 2018-04-09 DIAGNOSIS — J0100 Acute maxillary sinusitis, unspecified: Secondary | ICD-10-CM | POA: Diagnosis not present

## 2018-04-09 MED ORDER — PREDNISONE 10 MG PO TABS: ORAL_TABLET | ORAL | 0 refills | Status: DC

## 2018-04-09 MED ORDER — AMOXICILLIN-POT CLAVULANATE 875-125 MG PO TABS: 1.0000 | ORAL_TABLET | Freq: Two times a day (BID) | ORAL | 0 refills | Status: DC

## 2018-04-09 NOTE — Patient Instructions (Signed)
Let me know if not improving. Continue Mucinex D.

## 2018-04-09 NOTE — Progress Notes (Signed)
  Subjective:     Patient ID: Jill Robbins, female   DOB: 04/07/1961, 57 y.o.   MRN: [id number] Chief Complaint  Patient presents with  . Facial Pain    Patient comes in office today with compaint of sinus pain around her forehead for the past 3-4 weeks. Patient states that she has been taking otc Mucinex D and Ibuprofen PRN    HPI States she has noticed hoarseness at times and will get a small amount of purulent drainage in the AM. Takes cetirizine daily for allergies Feels like the congestion is locked in and she can't get it to drain.  Review of Systems     Objective:   Physical Exam  Constitutional: She appears well-developed and well-nourished. No distress.  Ears: T.M's intact without inflammation Sinuses: moderate maxillary sinus tenderness Throat: no tonsillar enlargement or exudate Neck: no cervical adenopathy Lungs: clear     Assessment:    1. Acute maxillary sinusitis, recurrence not specified - amoxicillin-clavulanate (AUGMENTIN) 875-125 MG tablet; Take 1 tablet by mouth 2 (two) times daily.  Dispense: 20 tablet; Refill: 0  2. Seasonal allergic rhinitis due to pollen - predniSONE (DELTASONE) 10 MG tablet; Taper daily as follows: 6 pills, 5, 4, 3, 2, 1  Dispense: 21 tablet; Refill: 0 - amoxicillin-clavulanate (AUGMENTIN) 875-125 MG tablet; Take 1 tablet by mouth 2 (two) times daily.  Dispense: 20 tablet; Refill: 0    Plan:    Call if not improving. Continue Mucinex D.

## 2018-05-15 LAB — HM PAP SMEAR: HM Pap smear: NORMAL

## 2018-06-29 ENCOUNTER — Other Ambulatory Visit: Payer: Self-pay | Admitting: Family Medicine

## 2018-06-29 ENCOUNTER — Telehealth: Payer: Self-pay | Admitting: Family Medicine

## 2018-06-29 MED ORDER — CETIRIZINE HCL 10 MG PO TABS
10.0000 mg | ORAL_TABLET | Freq: Every day | ORAL | 1 refills | Status: DC
Start: 2018-06-29 — End: 2018-10-05

## 2018-06-29 NOTE — Telephone Encounter (Signed)
[facility] faxed refill request for the following medications:  cetirizine (ZYRTEC) 10 MG tablet  Qty: 90 Last dispensed: 04/06/2018  Last Rx: LOV:  Please advise.

## 2018-06-29 NOTE — Telephone Encounter (Signed)
Please review. KW 

## 2018-06-29 NOTE — Telephone Encounter (Signed)
Pt needing refills on: cetirizine (ZYRTEC) 10 MG tablet  Please fill at:  [facility] - [place], [place] - [address] [phone] (Phone) [phone] (Fax)    Thanks, [employer]

## 2018-06-29 NOTE — Telephone Encounter (Signed)
done

## 2018-07-13 ENCOUNTER — Other Ambulatory Visit: Payer: Self-pay | Admitting: Family Medicine

## 2018-07-13 ENCOUNTER — Ambulatory Visit (INDEPENDENT_AMBULATORY_CARE_PROVIDER_SITE_OTHER): Admitting: Family Medicine

## 2018-07-13 ENCOUNTER — Encounter: Payer: Self-pay | Admitting: Family Medicine

## 2018-07-13 VITALS — BP 104/70 | HR 74 | Temp 98.0°F | Resp 16 | Wt 141.2 lb

## 2018-07-13 DIAGNOSIS — F419 Anxiety disorder, unspecified: Secondary | ICD-10-CM | POA: Diagnosis not present

## 2018-07-13 DIAGNOSIS — F439 Reaction to severe stress, unspecified: Secondary | ICD-10-CM

## 2018-07-13 MED ORDER — CLONAZEPAM 0.5 MG PO TABS: 0.5000 mg | ORAL_TABLET | Freq: Two times a day (BID) | ORAL | 0 refills | Status: DC | PRN

## 2018-07-13 NOTE — Patient Instructions (Signed)
Phone follow up in one to two weeks.

## 2018-07-13 NOTE — Progress Notes (Signed)
  Subjective:     Patient ID: Jill Robbins, female   DOB: 03/16/1961, 57 y.o.   MRN: [id number] Chief Complaint  Patient presents with  . Anxiety    Patient comes in office today to discuss symptoms of anxiety and situational stress. Patient reports that she has been treated for anxiety in the past and has suffered from condition for years, in the past month patient states symptoms increasingly became worse. Patient reports the following; stress from work, excessive worry, being sole caregiver to elderly parents and difficulty sleeping.    HPI States her husband is in graduate school and a lot of home responsibility falls on her at home in addition to working full time as a IT consultant. Has been on Lexapro in the past but wishes a prn medication only at this time.Reports she gained weight and lost hair while on Lexapro. States she rarely drinks alcohol.  Review of Systems     Objective:   Physical Exam  Constitutional: She appears well-developed and well-nourished.  Psychiatric:  Anxious/worried affect       Assessment:    1. Anxiety - clonazePAM (KLONOPIN) 0.5 MG tablet; Take 1 tablet (0.5 mg total) by mouth 2 (two) times daily as needed for anxiety.  Dispense: 20 tablet; Refill: 0  2. Situational stress - clonazePAM (KLONOPIN) 0.5 MG tablet; Take 1 tablet (0.5 mg total) by mouth 2 (two) times daily as needed for anxiety.  Dispense: 20 tablet; Refill: 0    Plan:    Phone f/uin 1-2 weeks.

## 2018-07-25 ENCOUNTER — Telehealth: Payer: Self-pay | Admitting: Family Medicine

## 2018-07-25 NOTE — Telephone Encounter (Signed)
Pt letting Nadine CountsBob know about the clonazePAM (KLONOPIN) 0.5 MG tablet:  Last week only used 3 half tablets This week she has used none/0  It's doing fine and helping.  Thanks, [employer]

## 2018-07-26 NOTE — Telephone Encounter (Signed)
See below. KW 

## 2018-07-26 NOTE — Telephone Encounter (Signed)
reviewed

## 2018-10-05 ENCOUNTER — Other Ambulatory Visit: Payer: Self-pay | Admitting: Family Medicine

## 2018-10-05 NOTE — Telephone Encounter (Signed)
[facility] faxed refill request for the following medications:  cetirizine (ZYRTEC) 10 MG tablet  Last dispensed: 10/04/2018  Date written: 06/29/2018  Please advise.

## 2018-10-08 MED ORDER — CETIRIZINE HCL 10 MG PO TABS
10.0000 mg | ORAL_TABLET | Freq: Every day | ORAL | 1 refills | Status: DC
Start: 2018-10-08 — End: 2019-04-04

## 2019-02-25 ENCOUNTER — Other Ambulatory Visit: Payer: Self-pay

## 2019-02-25 DIAGNOSIS — F419 Anxiety disorder, unspecified: Secondary | ICD-10-CM

## 2019-02-25 DIAGNOSIS — F439 Reaction to severe stress, unspecified: Secondary | ICD-10-CM

## 2019-02-25 MED ORDER — HYDROXYZINE PAMOATE 25 MG PO CAPS: 25.0000 mg | ORAL_CAPSULE | Freq: Every day | ORAL | 0 refills | Status: DC | PRN

## 2019-02-25 NOTE — Telephone Encounter (Signed)
I do not prescribe klonopin for acute anxiety. I will send her in hydroxyzine 25 mg daily PRN which can be used as klonopin would, can cause some sedation so be cautious with driving. Can discuss more options for anxiety management at follow up.

## 2019-02-25 NOTE — Telephone Encounter (Signed)
Patient is requesting a refill on Clonazepam be sent to [facility] pharmacy. She was previously seeing Mikki Santee. She has scheduled a appointment with Fabio Bering.

## 2019-03-08 ENCOUNTER — Ambulatory Visit (INDEPENDENT_AMBULATORY_CARE_PROVIDER_SITE_OTHER): Admitting: Physician Assistant

## 2019-03-08 ENCOUNTER — Other Ambulatory Visit: Payer: Self-pay

## 2019-03-08 ENCOUNTER — Other Ambulatory Visit: Payer: Self-pay | Admitting: Physician Assistant

## 2019-03-08 ENCOUNTER — Encounter: Payer: Self-pay | Admitting: Physician Assistant

## 2019-03-08 VITALS — BP 112/78 | HR 84 | Temp 98.2°F | Ht 69.0 in | Wt 141.0 lb

## 2019-03-08 DIAGNOSIS — Z130 Encounter for screening for diseases of the blood and blood-forming organs and certain disorders involving the immune mechanism: Secondary | ICD-10-CM

## 2019-03-08 DIAGNOSIS — F419 Anxiety disorder, unspecified: Secondary | ICD-10-CM

## 2019-03-08 DIAGNOSIS — Z1329 Encounter for screening for other suspected endocrine disorder: Secondary | ICD-10-CM | POA: Diagnosis not present

## 2019-03-08 DIAGNOSIS — Z131 Encounter for screening for diabetes mellitus: Secondary | ICD-10-CM

## 2019-03-08 DIAGNOSIS — Z13220 Encounter for screening for lipoid disorders: Secondary | ICD-10-CM | POA: Diagnosis not present

## 2019-03-08 DIAGNOSIS — Z1159 Encounter for screening for other viral diseases: Secondary | ICD-10-CM

## 2019-03-08 DIAGNOSIS — Z114 Encounter for screening for human immunodeficiency virus [HIV]: Secondary | ICD-10-CM

## 2019-03-08 MED ORDER — BUSPIRONE HCL 7.5 MG PO TABS: 7.5000 mg | ORAL_TABLET | Freq: Two times a day (BID) | ORAL | 0 refills | Status: DC

## 2019-03-08 NOTE — Progress Notes (Signed)
Patient: Jill Robbins Female    DOB: 05/02/1961   58 y.o.   MRN: [id number] Visit Date: 03/08/2019  Today's Provider: Trey SailorsAdriana M Bryton Waight, PA-C   Chief Complaint  Patient presents with  . Follow-up  . Anxiety   Subjective:   Previously saw Liane Combereborah Brady, NP and then Toni ArthursBob Chauvin, PA-C who has since retired.   HPI  Follow up for anxiety  The patient was last seen for this 7 months ago. Changes made at last visit include start clonazepam.  She reports excellent compliance with treatment. She feels that condition is Improved. She is not having side effects.   She reports a history of anxiety in herself and likely her parents though she reports this was never really talked about. She cites stressors from her job and also caring for her elderly parents. She is their healthcare power of attorney. She feels she worries excessively especially at night. She has never been on a maintenance anxiety medication. She does not want to take something addictive or habit forming. She is not yet established with a counselor but is interested in doing so.  [facility], mammogram  Colonoscopy dr Mechele Collinelliott 07/2014.  ------------------------------------------------------------------------------------   Allergies  Allergen Reactions  . Succinylcholine Other (See Comments)    Difficultly waking up  . Sulfa Antibiotics Rash  . Other     [place] - lips swelling, ulcers inside the mouth  . Peanut-Containing Drug Products Itching     Current Outpatient Medications:  .  Calcium Carb-Cholecalciferol (CALCIUM-VITAMIN D) 500-200 MG-UNIT tablet, Take 2 tablets by mouth daily. , Disp: , Rfl:  .  Carboxymethylcellul-Glycerin (LUBRICATING EYE DROPS OP), Apply 1 drop to eye daily as needed (dry eyes)., Disp: , Rfl:  .  cetirizine (ZYRTEC) 10 MG tablet, Take 1 tablet (10 mg total) by mouth daily., Disp: 90 tablet, Rfl: 1 .  Cholecalciferol (VITAMIN D PO), Take 1 capsule by mouth daily.,  Disp: , Rfl:  .  estradiol (VIVELLE-DOT) 0.1 MG/24HR patch, Apply 1 patch twice weekly, Disp: , Rfl: 3 .  GLUCOSAMINE-CHONDROITIN PO, Take 2 tablets by mouth daily., Disp: , Rfl:  .  Multiple Vitamin (MULTIVITAMIN WITH MINERALS) TABS tablet, Take 2 tablets by mouth daily., Disp: , Rfl:  .  Omega-3 Fatty Acids (FISH OIL PO), Take 2 capsules by mouth daily., Disp: , Rfl:  .  progesterone (PROMETRIUM) 100 MG capsule, Take 100 mg by mouth every evening. , Disp: , Rfl:  .  triamterene-hydrochlorothiazide (MAXZIDE-25) 37.5-25 MG tablet, Take 1 tablet by mouth daily. , Disp: , Rfl:  .  clonazePAM (KLONOPIN) 0.5 MG tablet, Take 1 tablet (0.5 mg total) by mouth 2 (two) times daily as needed for anxiety. (Patient not taking: Reported on 03/08/2019), Disp: 20 tablet, Rfl: 0 .  hydrOXYzine (VISTARIL) 25 MG capsule, Take 1 capsule (25 mg total) by mouth daily as needed. (Patient not taking: Reported on 03/08/2019), Disp: 30 capsule, Rfl: 0 .  valACYclovir (VALTREX) 1000 MG tablet, TAKE 2 TABLETS BY MOUTH EVERY 12 HOURS FOR 1 DAY AS NEEDED FOR COLD SORE (Patient not taking: Reported on 07/13/2018), Disp: 4 tablet, Rfl: 3  Review of Systems  Constitutional: Negative.   Cardiovascular: Negative.   Psychiatric/Behavioral: The patient is nervous/anxious.     Social History   Tobacco Use  . Smoking status: Never Smoker  . Smokeless tobacco: Never Used  Substance Use Topics  . Alcohol use: Yes    Comment: Rarely  Objective:   BP 112/78 (BP Location: Left Arm, Patient Position: Sitting, Cuff Size: Normal)   Pulse 84   Temp 98.2 F (36.8 C)   Ht 5\' 9"  (1.753 m)   Wt 141 lb (64 kg)   BMI 20.82 kg/m  Vitals:   03/08/19 1102  BP: 112/78  Pulse: 84  Temp: 98.2 F (36.8 C)  Weight: 141 lb (64 kg)  Height: 5\' 9"  (1.753 m)     Physical Exam Constitutional:      Appearance: Normal appearance.  Cardiovascular:     Rate and Rhythm: Normal rate.  Pulmonary:     Effort: Pulmonary effort is  normal.  Skin:    General: Skin is warm and dry.  Neurological:     Mental Status: She is alert and oriented to person, place, and time. Mental status is at baseline.  Psychiatric:        Mood and Affect: Affect normal. Mood is anxious.        Behavior: Behavior normal.      No results found for any visits on 03/08/19.     Assessment & Plan    1. Anxiety  Had detailed conversation about treatment for chronic anxiety vs PRN anxiety. Advised about risk of dependence, over sedation and dementia with chronic use of benzodiazepines. Patient has agreed to start on buspar as below with PRN hydroxyzine if needed. Phone number for counselor provided.   - busPIRone (BUSPAR) 7.5 MG tablet; Take 1 tablet (7.5 mg total) by mouth 2 (two) times daily.  Dispense: 180 tablet; Refill: 0  2. Screening for deficiency anemia  - CBC with Differential  3. Lipid screening  - Lipid Profile  4. Thyroid disorder screening  - TSH  5. Diabetes mellitus screening  - Comprehensive Metabolic Panel (CMET)  6. Encounter for hepatitis C screening test for low risk patient  - Hepatitis C antibody  7. Encounter for screening for HIV  - HIV antibody (with reflex)  The entirety of the information documented in the History of Present Illness, Review of Systems and Physical Exam were personally obtained by me. Portions of this information were initially documented by Lynford Humphrey, CMA and reviewed by me for thoroughness and accuracy.   F/u 6 weeks for anxiety, telephone call    Trinna Post, PA-C  [place]

## 2019-03-08 NOTE — Patient Instructions (Signed)

## 2019-03-09 LAB — CBC WITH DIFFERENTIAL/PLATELET
Basophils Absolute: 0.1 10*3/uL (ref 0.0–0.2)
Basos: 1 %
EOS (ABSOLUTE): 0.3 10*3/uL (ref 0.0–0.4)
Eos: 5 %
Hematocrit: 42.7 % (ref 34.0–46.6)
Hemoglobin: 14.3 g/dL (ref 11.1–15.9)
Immature Grans (Abs): 0 10*3/uL (ref 0.0–0.1)
Immature Granulocytes: 0 %
Lymphocytes Absolute: 2 10*3/uL (ref 0.7–3.1)
Lymphs: 31 %
MCH: 31.5 pg (ref 26.6–33.0)
MCHC: 33.5 g/dL (ref 31.5–35.7)
MCV: 94 fL (ref 79–97)
Monocytes Absolute: 0.6 10*3/uL (ref 0.1–0.9)
Monocytes: 10 %
Neutrophils Absolute: 3.4 10*3/uL (ref 1.4–7.0)
Neutrophils: 53 %
Platelets: 268 10*3/uL (ref 150–450)
RBC: 4.54 x10E6/uL (ref 3.77–5.28)
RDW: 11.9 % (ref 11.7–15.4)
WBC: 6.3 10*3/uL (ref 3.4–10.8)

## 2019-03-09 LAB — COMPREHENSIVE METABOLIC PANEL
ALT: 16 IU/L (ref 0–32)
AST: 20 IU/L (ref 0–40)
Albumin/Globulin Ratio: 2 (ref 1.2–2.2)
Albumin: 4.7 g/dL (ref 3.8–4.9)
Alkaline Phosphatase: 56 IU/L (ref 39–117)
BUN/Creatinine Ratio: 17 (ref 9–23)
BUN: 12 mg/dL (ref 6–24)
Bilirubin Total: 0.9 mg/dL (ref 0.0–1.2)
CO2: 24 mmol/L (ref 20–29)
Calcium: 9.6 mg/dL (ref 8.7–10.2)
Chloride: 99 mmol/L (ref 96–106)
Creatinine, Ser: 0.7 mg/dL (ref 0.57–1.00)
GFR calc Af Amer: 112 mL/min/{1.73_m2} (ref >59)
GFR calc non Af Amer: 97 mL/min/{1.73_m2} (ref >59)
Globulin, Total: 2.4 g/dL (ref 1.5–4.5)
Glucose: 82 mg/dL (ref 65–99)
Potassium: 3.8 mmol/L (ref 3.5–5.2)
Sodium: 139 mmol/L (ref 134–144)
Total Protein: 7.1 g/dL (ref 6.0–8.5)

## 2019-03-09 LAB — LIPID PANEL
Chol/HDL Ratio: 2.7 ratio (ref 0.0–4.4)
Cholesterol, Total: 162 mg/dL (ref 100–199)
HDL: 59 mg/dL (ref >39)
LDL Calculated: 76 mg/dL (ref 0–99)
Triglycerides: 134 mg/dL (ref 0–149)
VLDL Cholesterol Cal: 27 mg/dL (ref 5–40)

## 2019-03-09 LAB — HEPATITIS C ANTIBODY: Hep C Virus Ab: 0.1 s/co ratio (ref 0.0–0.9)

## 2019-03-09 LAB — TSH: TSH: 1.99 u[IU]/mL (ref 0.450–4.500)

## 2019-03-09 LAB — HIV ANTIBODY (ROUTINE TESTING W REFLEX): HIV Screen 4th Generation wRfx: NONREACTIVE

## 2019-03-12 ENCOUNTER — Telehealth: Payer: Self-pay

## 2019-03-12 NOTE — Telephone Encounter (Signed)
-----   Message from Trinna Post, [place] sent at 03/12/2019  8:40 AM EDT ----- Her blood work looks great, no diabetes. Cholesterol well controlled. Screening tests negative.

## 2019-03-12 NOTE — Telephone Encounter (Signed)
Patient advised as below.  

## 2019-03-14 NOTE — Progress Notes (Signed)
Requested info.

## 2019-03-26 ENCOUNTER — Telehealth: Payer: Self-pay

## 2019-03-26 NOTE — Telephone Encounter (Signed)
Patient advised to call Dr. Percell [place] office to schedule her repeat colonoscopy in 07/2019. sd

## 2019-04-04 ENCOUNTER — Other Ambulatory Visit: Payer: Self-pay | Admitting: Physician Assistant

## 2019-04-04 MED ORDER — CETIRIZINE HCL 10 MG PO TABS: 10.0000 mg | ORAL_TABLET | Freq: Every day | ORAL | 1 refills | Status: DC

## 2019-04-04 NOTE — Telephone Encounter (Signed)
[facility] faxed refill request for the following medications:  cetirizine (ZYRTEC) 10 MG tablet  Last Rx: 10/08/2018 LOV: 03/08/59 Please advise. Thanks TNP

## 2019-04-22 ENCOUNTER — Ambulatory Visit (INDEPENDENT_AMBULATORY_CARE_PROVIDER_SITE_OTHER): Admitting: Physician Assistant

## 2019-04-22 DIAGNOSIS — F419 Anxiety disorder, unspecified: Secondary | ICD-10-CM | POA: Diagnosis not present

## 2019-04-22 MED ORDER — BUSPIRONE HCL 15 MG PO TABS: 15.0000 mg | ORAL_TABLET | Freq: Two times a day (BID) | ORAL | 0 refills | Status: AC

## 2019-04-22 NOTE — Patient Instructions (Addendum)
Increase dose by 7.5 mg onto one dose every three days. Starting tonight, you may take two tablets at night and then one in the morning for tree days. Then on day four, take two tablets in the morning and two tablets at night.     Generalized Anxiety Disorder, Adult Generalized anxiety disorder (GAD) is a mental health disorder. People with this condition constantly worry about everyday events. Unlike normal anxiety, worry related to GAD is not triggered by a specific event. These worries also do not fade or get better with time. GAD interferes with life functions, including relationships, work, and school. GAD can vary from mild to severe. People with severe GAD can have intense waves of anxiety with physical symptoms (panic attacks). What are the causes? The exact cause of GAD is not known. What increases the risk? This condition is more likely to develop in:  Women.  People who have a family history of anxiety disorders.  People who are very shy.  People who experience very stressful life events, such as the death of a loved one.  People who have a very stressful family environment. What are the signs or symptoms? People with GAD often worry excessively about many things in their lives, such as their health and family. They may also be overly concerned about:  Doing well at work.  Being on time.  Natural disasters.  Friendships. Physical symptoms of GAD include:  Fatigue.  Muscle tension or having muscle twitches.  Trembling or feeling shaky.  Being easily startled.  Feeling like your heart is pounding or racing.  Feeling out of breath or like you cannot take a deep breath.  Having trouble falling asleep or staying asleep.  Sweating.  Nausea, diarrhea, or irritable bowel syndrome (IBS).  Headaches.  Trouble concentrating or remembering facts.  Restlessness.  Irritability. How is this diagnosed? Your health care provider can diagnose GAD based on your  symptoms and medical history. You will also have a physical exam. The health care provider will ask specific questions about your symptoms, including how severe they are, when they started, and if they come and go. Your health care provider may ask you about your use of alcohol or drugs, including prescription medicines. Your health care provider may refer you to a mental health specialist for further evaluation. Your health care provider will do a thorough examination and may perform additional tests to rule out other possible causes of your symptoms. To be diagnosed with GAD, a person must have anxiety that:  Is out of his or her control.  Affects several different aspects of his or her life, such as work and relationships.  Causes distress that makes him or her unable to take part in normal activities.  Includes at least three physical symptoms of GAD, such as restlessness, fatigue, trouble concentrating, irritability, muscle tension, or sleep problems. Before your health care provider can confirm a diagnosis of GAD, these symptoms must be present more days than they are not, and they must last for six months or longer. How is this treated? The following therapies are usually used to treat GAD:  Medicine. Antidepressant medicine is usually prescribed for long-term daily control. Antianxiety medicines may be added in severe cases, especially when panic attacks occur.  Talk therapy (psychotherapy). Certain types of talk therapy can be helpful in treating GAD by providing support, education, and guidance. Options include: ? Cognitive behavioral therapy (CBT). People learn coping skills and techniques to ease their anxiety. They learn to identify  unrealistic or negative thoughts and behaviors and to replace them with positive ones. ? Acceptance and commitment therapy (ACT). This treatment teaches people how to be mindful as a way to cope with unwanted thoughts and feelings. ? Biofeedback. This  process trains you to manage your body's response (physiological response) through breathing techniques and relaxation methods. You will work with a therapist while machines are used to monitor your physical symptoms.  Stress management techniques. These include yoga, meditation, and exercise. A mental health specialist can help determine which treatment is best for you. Some people see improvement with one type of therapy. However, other people require a combination of therapies. Follow these instructions at home:  Take over-the-counter and prescription medicines only as told by your health care provider.  Try to maintain a normal routine.  Try to anticipate stressful situations and allow extra time to manage them.  Practice any stress management or self-calming techniques as taught by your health care provider.  Do not punish yourself for setbacks or for not making progress.  Try to recognize your accomplishments, even if they are small.  Keep all follow-up visits as told by your health care provider. This is important. Contact a health care provider if:  Your symptoms do not get better.  Your symptoms get worse.  You have signs of depression, such as: ? A persistently sad, cranky, or irritable mood. ? Loss of enjoyment in activities that used to bring you joy. ? Change in weight or eating. ? Changes in sleeping habits. ? Avoiding friends or family members. ? Loss of energy for normal tasks. ? Feelings of guilt or worthlessness. Get help right away if:  You have serious thoughts about hurting yourself or others. If you ever feel like you may hurt yourself or others, or have thoughts about taking your own life, get help right away. You can go to your nearest emergency department or call:  Your local emergency services (911 in the [place]  A suicide crisis helpline, such as the National Suicide Prevention Lifeline at 608 600 54461-361 819 9722. This is open 24 hours a  day. Summary  Generalized anxiety disorder (GAD) is a mental health disorder that involves worry that is not triggered by a specific event.  People with GAD often worry excessively about many things in their lives, such as their health and family.  GAD may cause physical symptoms such as restlessness, trouble concentrating, sleep problems, frequent sweating, nausea, diarrhea, headaches, and trembling or muscle twitching.  A mental health specialist can help determine which treatment is best for you. Some people see improvement with one type of therapy. However, other people require a combination of therapies. This information is not intended to replace advice given to you by your health care provider. Make sure you discuss any questions you have with your health care provider. Document Released: 12/24/2012 Document Revised: 08/11/2017 Document Reviewed: 07/19/2016 Elsevier Patient Education  2020 [employer]

## 2019-04-22 NOTE — Progress Notes (Signed)
Patient: Jill Robbins Female    DOB: 03/09/1961   58 y.o.   MRN: [id number] Visit Date: 04/23/2019  Today's Provider: Trey SailorsAdriana M Jeyli Zwicker, PA-C   Chief Complaint  Patient presents with  . Follow-up  . Anxiety   Subjective:  Virtual Visit via Telephone Note  I connected with Jill Robbins on 04/23/19 at  2:00 PM EDT by telephone and verified that I am speaking with the correct person using two identifiers.   I discussed the limitations, risks, security and privacy concerns of performing an evaluation and management service by telephone and the availability of in person appointments. I also discussed with the patient that there may be a patient responsible charge related to this service. The patient expressed understanding and agreed to proceed.   History of Present Illness: Patient was last seen in the office 2 months ago, and she was started on buspirone 7.5mg  daily. She reports that she has tolerated the medication well, but she has not noticed any improvement in her mood. Started taking medication the first week in July. Did previously report excessive worry at night, wakes up at night at 2:00 AM. Her family has not noticed any different. Coworker notice today that she was extremely agitated with work stressors related to COVID. Yoga class is tonight, which can bring patient relief at times. Has not used PRN medications including her vistaril, it was in her bathroom cabinet. Has not reached out to a counselor or a therapist just yet. Her free days she is currently caring for her parents and she has some difficulty scheduling.     Allergies  Allergen Reactions  . Succinylcholine Other (See Comments)    Difficultly waking up  . Sulfa Antibiotics Rash  . Other     [place] - lips swelling, ulcers inside the mouth  . Peanut-Containing Drug Products Itching     Current Outpatient Medications:  .  Calcium Carb-Cholecalciferol (CALCIUM-VITAMIN D) 500-200 MG-UNIT tablet, Take 2 tablets by  mouth daily. , Disp: , Rfl:  .  Carboxymethylcellul-Glycerin (LUBRICATING EYE DROPS OP), Apply 1 drop to eye daily as needed (dry eyes)., Disp: , Rfl:  .  cetirizine (ZYRTEC) 10 MG tablet, Take 1 tablet (10 mg total) by mouth daily., Disp: 90 tablet, Rfl: 1 .  Cholecalciferol (VITAMIN D PO), Take 1 capsule by mouth daily., Disp: , Rfl:  .  estradiol (VIVELLE-DOT) 0.1 MG/24HR patch, Apply 1 patch twice weekly, Disp: , Rfl: 3 .  GLUCOSAMINE-CHONDROITIN PO, Take 2 tablets by mouth daily., Disp: , Rfl:  .  Multiple Vitamin (MULTIVITAMIN WITH MINERALS) TABS tablet, Take 2 tablets by mouth daily., Disp: , Rfl:  .  Omega-3 Fatty Acids (FISH OIL PO), Take 2 capsules by mouth daily., Disp: , Rfl:  .  progesterone (PROMETRIUM) 100 MG capsule, Take 100 mg by mouth every evening. , Disp: , Rfl:  .  triamterene-hydrochlorothiazide (MAXZIDE-25) 37.5-25 MG tablet, Take 1 tablet by mouth daily. , Disp: , Rfl:  .  busPIRone (BUSPAR) 15 MG tablet, Take 1 tablet (15 mg total) by mouth 2 (two) times daily., Disp: 180 tablet, Rfl: 0 .  hydrOXYzine (VISTARIL) 25 MG capsule, Take 1 capsule (25 mg total) by mouth daily as needed. (Patient not taking: Reported on 03/08/2019), Disp: 30 capsule, Rfl: 0  Review of Systems  Constitutional: Negative for activity change, chills, fatigue and fever.  Respiratory: Negative for cough and shortness of breath.   Cardiovascular: Negative for chest pain, palpitations and leg  swelling.  Allergic/Immunologic: Negative for environmental allergies.  Neurological: Negative for dizziness, numbness and headaches.  Psychiatric/Behavioral: Negative for agitation, confusion, decreased concentration, self-injury, sleep disturbance and suicidal ideas. The patient is nervous/anxious.     Social History   Tobacco Use  . Smoking status: Never Smoker  . Smokeless tobacco: Never Used  Substance Use Topics  . Alcohol use: Yes    Comment: Rarely      Objective:   There were no vitals taken  for this visit. There were no vitals filed for this visit.   Physical Exam   No results found for any visits on 04/22/19.     Assessment & Plan    1. Anxiety  We will increase buspar slowly to 15 mg twice daily and I have instructed patient how to do this. Have counseled that 30 mg twice daily is the maximum dose. We discussed that it would be helpful to carry hydroxyzine on hand. She is remaining open to scheduling an appointment with a counselor when her schedule has more opportunity.   - busPIRone (BUSPAR) 15 MG tablet; Take 1 tablet (15 mg total) by mouth 2 (two) times daily.  Dispense: 180 tablet; Refill: 0  The entirety of the information documented in the History of Present Illness, Review of Systems and Physical Exam were personally obtained by me. Portions of this information were initially documented by Wilburt Finlay, CMA and reviewed by me for thoroughness and accuracy.         I, Odell Presley, CMA, am acting as a Education administrator for [employer], PA-C.   Trinna Post, PA-C  [facility]

## 2019-05-14 ENCOUNTER — Other Ambulatory Visit: Payer: Self-pay | Admitting: Obstetrics and Gynecology

## 2019-05-14 DIAGNOSIS — N649 Disorder of breast, unspecified: Secondary | ICD-10-CM

## 2019-05-23 ENCOUNTER — Other Ambulatory Visit: Payer: Self-pay

## 2019-05-23 ENCOUNTER — Ambulatory Visit

## 2019-05-23 ENCOUNTER — Ambulatory Visit
Admission: RE | Admit: 2019-05-23 | Discharge: 2019-05-23 | Disposition: A | Discharge status: Home / Self Care | Source: Ambulatory Visit | Attending: Obstetrics and Gynecology | Admitting: Obstetrics and Gynecology

## 2019-05-23 DIAGNOSIS — N649 Disorder of breast, unspecified: Secondary | ICD-10-CM

## 2019-06-07 ENCOUNTER — Other Ambulatory Visit: Payer: Self-pay | Admitting: Physician Assistant

## 2019-10-07 ENCOUNTER — Other Ambulatory Visit: Payer: Self-pay | Admitting: Physician Assistant

## 2019-10-18 ENCOUNTER — Encounter: Payer: Self-pay | Admitting: Physician Assistant

## 2019-10-18 ENCOUNTER — Other Ambulatory Visit: Payer: Self-pay

## 2019-10-18 ENCOUNTER — Ambulatory Visit (INDEPENDENT_AMBULATORY_CARE_PROVIDER_SITE_OTHER): Admitting: Physician Assistant

## 2019-10-18 VITALS — BP 113/79 | HR 93 | Temp 96.9°F | Wt 142.2 lb

## 2019-10-18 DIAGNOSIS — F419 Anxiety disorder, unspecified: Secondary | ICD-10-CM

## 2019-10-18 DIAGNOSIS — I10 Essential (primary) hypertension: Secondary | ICD-10-CM | POA: Diagnosis not present

## 2019-10-18 MED ORDER — TRIAMTERENE-HCTZ 37.5-25 MG PO TABS: 1.0000 | ORAL_TABLET | Freq: Every day | ORAL | 1 refills | Status: DC

## 2019-10-18 MED ORDER — BUSPIRONE HCL 15 MG PO TABS: 15.0000 mg | ORAL_TABLET | Freq: Two times a day (BID) | ORAL | 1 refills | Status: DC

## 2019-10-18 NOTE — Progress Notes (Signed)
Patient: Jill Robbins Female    DOB: Mar 31, 1961   59 y.o.   MRN: [id number] Visit Date: 10/18/2019  Today's Provider: Trinna Post, PA-C   Chief Complaint  Patient presents with  . Medication Management   Subjective:     HPI   Patient presents today for a refill on Buspirone hcl. She is taking 7.5 mg BID.   HTN: She is taking maxzide 25 mg QD.   BP Readings from Last 3 Encounters:  10/18/19 113/79  03/08/19 112/78  07/13/18 104/70     Allergies  Allergen Reactions  . Succinylcholine Other (See Comments)    Difficultly waking up  . Sulfa Antibiotics Rash  . Other     [place] - lips swelling, ulcers inside the mouth  . Peanut-Containing Drug Products Itching     Current Outpatient Medications:  .  Calcium Carb-Cholecalciferol (CALCIUM-VITAMIN D) 500-200 MG-UNIT tablet, Take 2 tablets by mouth daily. , Disp: , Rfl:  .  Carboxymethylcellul-Glycerin (LUBRICATING EYE DROPS OP), Apply 1 drop to eye daily as needed (dry eyes)., Disp: , Rfl:  .  cetirizine (ZYRTEC) 10 MG tablet, TAKE 1 TABLET BY MOUTH DAILY, Disp: 90 tablet, Rfl: 1 .  Cholecalciferol (VITAMIN D PO), Take 1 capsule by mouth daily., Disp: , Rfl:  .  estradiol (VIVELLE-DOT) 0.1 MG/24HR patch, Apply 1 patch twice weekly, Disp: , Rfl: 3 .  GLUCOSAMINE-CHONDROITIN PO, Take 2 tablets by mouth daily., Disp: , Rfl:  .  hydrOXYzine (VISTARIL) 25 MG capsule, Take 1 capsule (25 mg total) by mouth daily as needed. (Patient not taking: Reported on 03/08/2019), Disp: 30 capsule, Rfl: 0 .  Multiple Vitamin (MULTIVITAMIN WITH MINERALS) TABS tablet, Take 2 tablets by mouth daily., Disp: , Rfl:  .  Omega-3 Fatty Acids (FISH OIL PO), Take 2 capsules by mouth daily., Disp: , Rfl:  .  progesterone (PROMETRIUM) 100 MG capsule, Take 100 mg by mouth every evening. , Disp: , Rfl:  .  triamterene-hydrochlorothiazide (MAXZIDE-25) 37.5-25 MG tablet, Take 1 tablet by mouth daily. , Disp: , Rfl:  .  valACYclovir (VALTREX) 1000 MG  tablet, TAKE 2 TABLETS BY MOUTH NOW  THEN REPEATIN 12 HOURS AS DIRECTED, Disp: 30 tablet, Rfl: 1  Review of Systems  Social History   Tobacco Use  . Smoking status: Never Smoker  . Smokeless tobacco: Never Used  Substance Use Topics  . Alcohol use: Yes    Comment: Rarely      Objective:   There were no vitals taken for this visit. There were no vitals filed for this visit.There is no height or weight on file to calculate BMI.   Physical Exam Constitutional:      Appearance: Normal appearance.  Cardiovascular:     Rate and Rhythm: Normal rate and regular rhythm.     Heart sounds: Normal heart sounds.  Pulmonary:     Effort: Pulmonary effort is normal.     Breath sounds: Normal breath sounds.  Skin:    General: Skin is warm and dry.  Neurological:     Mental Status: She is alert and oriented to person, place, and time. Mental status is at baseline.  Psychiatric:        Mood and Affect: Mood normal.        Behavior: Behavior normal.      No results found for any visits on 10/18/19.     Assessment & Plan    1. Anxiety  Refill as below.  -  busPIRone (BUSPAR) 15 MG tablet; Take 1 tablet (15 mg total) by mouth 2 (two) times daily.  Dispense: 180 tablet; Refill: 1  2. Essential hypertension  Refill as below.   - triamterene-hydrochlorothiazide (MAXZIDE-25) 37.5-25 MG tablet; Take 1 tablet by mouth daily.  Dispense: 90 tablet; Refill: 1 - TSH - Lipid panel - Comprehensive metabolic panel - CBC with Differential/Platelet  The entirety of the information documented in the History of Present Illness, Review of Systems and Physical Exam were personally obtained by me. Portions of this information were initially documented by [facility] and reviewed by me for thoroughness and accuracy.      Trey Sailors, PA-C  [facility]

## 2019-10-18 NOTE — Patient Instructions (Signed)

## 2019-11-07 ENCOUNTER — Telehealth: Payer: Self-pay | Admitting: Physician Assistant

## 2020-01-02 ENCOUNTER — Other Ambulatory Visit: Payer: Self-pay | Admitting: Physician Assistant

## 2020-01-02 DIAGNOSIS — J301 Allergic rhinitis due to pollen: Secondary | ICD-10-CM

## 2020-01-02 MED ORDER — CETIRIZINE HCL 10 MG PO TABS: 10.0000 mg | ORAL_TABLET | Freq: Every day | ORAL | 1 refills | Status: DC

## 2020-01-02 NOTE — Telephone Encounter (Signed)
[facility] faxed refill request for the following medications:  cetirizine (ZYRTEC) 10 MG tablet   Please advise.  Thanks, [employer]

## 2020-01-02 NOTE — Telephone Encounter (Signed)
Patient [name] was on 10/18/2019 and medication was send into pharmacy.

## 2020-01-17 ENCOUNTER — Other Ambulatory Visit: Payer: Self-pay | Admitting: Physician Assistant

## 2020-01-17 DIAGNOSIS — J301 Allergic rhinitis due to pollen: Secondary | ICD-10-CM

## 2020-01-17 MED ORDER — CETIRIZINE HCL 10 MG PO TABS: 10.0000 mg | ORAL_TABLET | Freq: Every day | ORAL | 1 refills | Status: DC

## 2020-01-17 NOTE — Telephone Encounter (Signed)
[facility] faxed refill request for the following medications: ° °cetirizine (ZYRTEC) 10 MG tablet ° ° °Please advise. ° °Thanks, °[facility] ° °

## 2020-04-06 ENCOUNTER — Other Ambulatory Visit: Payer: Self-pay | Admitting: Physician Assistant

## 2020-04-06 DIAGNOSIS — F419 Anxiety disorder, unspecified: Secondary | ICD-10-CM

## 2020-04-06 NOTE — Telephone Encounter (Signed)
Requested medication (s) are due for refill today:med RX expired  Requested medication (s) are on the active medication list:No expired 01/16/20  Last refill: 10/18/19  until  01/16/20  Future visit scheduled: no  Notes to clinic: Medication order expired 01/16/20 Not on current list.    Requested Prescriptions  Pending Prescriptions Disp Refills   busPIRone (BUSPAR) 15 MG tablet [Pharmacy Med Name: BUSPIRONE HCL 15 MG TAB] 180 tablet 1    Sig: TAKE 1 TABLET BY MOUTH TWICE DAILY      Psychiatry: Anxiolytics/Hypnotics - Non-controlled Passed - 04/06/2020  2:30 PM      Passed - Valid encounter within last 6 months    Recent Outpatient Visits           5 months ago Anxiety   [facility] [place], [place]   11 months ago Anxiety   [facility], [place], [place]   1 year ago Anxiety   [facility] [place], [place], [place]   1 year ago Anxiety   [facility] [place], [place]   1 year ago Acute maxillary sinusitis, recurrence not specified   [facility], [place], [place]

## 2020-06-26 ENCOUNTER — Other Ambulatory Visit: Payer: Self-pay | Admitting: Obstetrics and Gynecology

## 2020-06-26 DIAGNOSIS — R928 Other abnormal and inconclusive findings on diagnostic imaging of breast: Secondary | ICD-10-CM

## 2020-07-03 ENCOUNTER — Other Ambulatory Visit: Payer: Self-pay

## 2020-07-03 ENCOUNTER — Other Ambulatory Visit: Payer: Self-pay | Admitting: Obstetrics and Gynecology

## 2020-07-03 ENCOUNTER — Ambulatory Visit
Admission: RE | Admit: 2020-07-03 | Discharge: 2020-07-03 | Disposition: A | Discharge status: Home / Self Care | Source: Ambulatory Visit | Attending: Obstetrics and Gynecology | Admitting: Obstetrics and Gynecology

## 2020-07-03 DIAGNOSIS — R921 Mammographic calcification found on diagnostic imaging of breast: Secondary | ICD-10-CM

## 2020-07-03 DIAGNOSIS — R928 Other abnormal and inconclusive findings on diagnostic imaging of breast: Secondary | ICD-10-CM

## 2020-07-14 ENCOUNTER — Ambulatory Visit
Admission: RE | Admit: 2020-07-14 | Discharge: 2020-07-14 | Disposition: A | Discharge status: Home / Self Care | Source: Ambulatory Visit | Attending: Obstetrics and Gynecology | Admitting: Obstetrics and Gynecology

## 2020-07-14 ENCOUNTER — Other Ambulatory Visit: Payer: Self-pay

## 2020-07-14 DIAGNOSIS — R921 Mammographic calcification found on diagnostic imaging of breast: Secondary | ICD-10-CM

## 2020-07-20 ENCOUNTER — Ambulatory Visit: Payer: Self-pay | Admitting: Surgery

## 2020-07-20 DIAGNOSIS — N6092 Unspecified benign mammary dysplasia of left breast: Secondary | ICD-10-CM

## 2020-07-20 NOTE — H&P (Signed)
Jill Robbins Appointment: 07/20/2020 9:40 AM Location: [facility] Patient #: [id number] DOB: 1961-01-08 Married / Language: [place] / Race: White Female  History of Present Illness Jill Fus A. Jill Zemaitis MD; 07/20/2020 12:54 PM) Patient words: Patient sent at the request of the [facility] [place] Dr. Manson Robbins after recent screening mammogram revealed what appeared to be left breast calcifications. She has a history of left breast biopsy in 2010 for calcifications and was found to have fibrocystic type changes. There is a 2 cm cluster of pleomorphic calcifications upper outer quadrant left breast core biopsy showed atypical lobular hyperplasia and signs of fibrocystic type changes. Patient denies any history of breast pain, nipple discharge or recent breast mass bilaterally.       Patient returns after screening study for evaluation of possible LEFT breast calcifications. History of benign excisional biopsy of LEFT breast calcifications in 2010.  EXAM: DIGITAL DIAGNOSTIC LEFT MAMMOGRAM WITH CAD  COMPARISON: Previous exam(s).  ACR Breast Density Category c: The breast tissue is heterogeneously dense, which may obscure small masses.  FINDINGS: Magnified views are performed of calcifications in the UPPER-OUTER QUADRANT of the LEFT breast. In this region there is a group of faint pleomorphic calcifications spanning 2.2 x 0.7 x 2.0 centimeters. Calcifications do not layer on the true LATERAL projection.  Mammographic images were processed with CAD.  IMPRESSION: Indeterminate LEFT breast calcifications spanning 2.2 centimeters.  RECOMMENDATION: Recommend stereotactic guided core biopsy of anterior and posterior components of LEFT breast calcifications.  I have discussed the findings and recommendations with the patient. If applicable, a reminder letter will be sent to the patient regarding the next appointment.  BI-RADS CATEGORY 4: Suspicious.   Electronically  Signed By: Norva Pavlov M.D. On: 07/03/2020 11:28              Diagnosis 1. Breast, left, needle core biopsy, upper outer - FOCAL LOBULAR NEOPLASIA (ATYPICAL LOBULAR HYPERPLASIA) - ADENOSIS AND FIBROCYSTIC CHANGES WITH CALCIFICATIONS - PSEUDOANGIOMATOUS STROMAL HYPERPLASIA 2. Breast, left, needle core biopsy, upper outer - ADENOSIS AND FIBROCYSTIC CHANGES WITH APOCRINE METAPLASIA - DUCT ECTASIA - PSEUDOANGIOMATOUS STROMAL HYPERPLASIA - NO MALIGNANCY IDENTIFIED.  The patient is a 59 year old female.   Past Surgical History (Jill Robbins, RMA; 07/20/2020 9:37 AM) Breast Biopsy Bilateral. multiple Colon Polyp Removal - Colonoscopy Oral Surgery  Diagnostic Studies History (Jill Robbins, RMA; 07/20/2020 9:37 AM) Colonoscopy within last year Mammogram within last year Pap Smear 1-5 years ago  Allergies (Jill Robbins, RMA; 07/20/2020 9:42 AM) Sulfa Antibiotics Succinylcholine Chloride *NEUROMUSCULAR AGENTS* Allergies Reconciled  Medication History (Jill Robbins, RMA; 07/20/2020 9:41 AM) Progesterone (100MG  Capsule, Oral) Active. Levocetirizine Dihydrochloride (5MG  Tablet, Oral) Active. Triamterene-HCTZ (37.5-25MG  Tablet, Oral) Active. Estradiol (0.1MG  Patch Weekly, Transdermal) Active. busPIRone HCl (7.5MG  Tablet, Oral) Active. Multi-Day (Oral) Active. Calcium Citrate (Oral) Specific strength unknown - Active. Omega 3 (Oral) Specific strength unknown - Active. GNP Glucosame Maximum Strength (1000MG  Tablet, Oral) Active. Medications Reconciled  Social History (Jill A. , RMA; 07/20/2020 9:37 AM) Alcohol use Occasional alcohol use. Caffeine use Coffee, Tea. No drug use Tobacco use Never smoker.  Family History (Jill A. , RMA; 07/20/2020 9:37 AM) Anesthetic complications Mother, Sister. Arthritis Mother. Cerebrovascular Accident Father. Diabetes Mellitus Mother. Heart Disease Father. Hypertension  Brother, Family Members In [place], Father.  Pregnancy / Birth History (Jill Robbins, RMA; 07/20/2020 9:37 AM) Age at menarche 17 years. Age of menopause 80-55 Gravida 0 Para 0  Other Problems (Jill Robbins, RMA; 07/20/2020 9:37 AM) Anxiety Disorder  General anesthesia - complications High blood pressure Lump In Breast     Review of Systems (Jill A. Brown RMA; 07/20/2020 9:37 AM) General Not Present- Appetite Loss, Chills, Fatigue, Fever, Night Sweats, Weight Gain and Weight Loss. Skin Not Present- Change in Wart/Mole, Dryness, Hives, Jaundice, New Lesions, Non-Healing Wounds, Rash and Ulcer. HEENT Not Present- Earache, Hearing Loss, Hoarseness, Nose Bleed, Oral Ulcers, Ringing in the Ears, Seasonal Allergies, Sinus Pain, Sore Throat, Visual Disturbances, Wears glasses/contact lenses and Yellow Eyes. Respiratory Not Present- Bloody sputum, Chronic Cough, Difficulty Breathing, Snoring and Wheezing. Breast Present- Breast Mass. Not Present- Breast Pain, Nipple Discharge and Skin Changes. Cardiovascular Not Present- Chest Pain, Difficulty Breathing Lying Down, Leg Cramps, Palpitations, Rapid Heart Rate, Shortness of Breath and Swelling of Extremities. Gastrointestinal Not Present- Abdominal Pain, Bloating, Bloody Stool, Change in Bowel Habits, Chronic diarrhea, Constipation, Difficulty Swallowing, Excessive gas, Gets full quickly at meals, Hemorrhoids, Indigestion, Nausea, Rectal Pain and Vomiting. Female Genitourinary Not Present- Frequency, Nocturia, Painful Urination, Pelvic Pain and Urgency. Musculoskeletal Not Present- Back Pain, Joint Pain, Joint Stiffness, Muscle Pain, Muscle Weakness and Swelling of Extremities. Neurological Not Present- Decreased Memory, Fainting, Headaches, Numbness, Seizures, Tingling, Tremor, Trouble walking and Weakness. Psychiatric Not Present- Anxiety, Bipolar, Change in Sleep Pattern, Depression, Fearful and Frequent crying. Endocrine Not  Present- Cold Intolerance, Excessive Hunger, Hair Changes, Heat Intolerance, Hot flashes and New Diabetes. Hematology Not Present- Blood Thinners, Easy Bruising, Excessive bleeding, Gland problems, HIV and Persistent Infections.  Vitals (Jill A. Brown RMA; 07/20/2020 9:42 AM) 07/20/2020 9:41 AM Weight: 137.4 lb Height: 70in Body Surface Area: 1.78 m Body Mass Index: 19.71 kg/m  Temp.: 98.34F  Pulse: 89 (Regular)  BP: 122/76(Sitting, Left Arm, Standard)        Physical Exam (Samhitha Rosen A. Mariaisabel Bodiford MD; 07/20/2020 12:54 PM)  General Mental Status-Alert. General Appearance-Consistent with stated age. Hydration-Well hydrated. Voice-Normal.  Head and Neck Head-normocephalic, atraumatic with no lesions or palpable masses. Trachea-midline. Thyroid Gland Characteristics - normal size and consistency.  Eye Eyeball - Bilateral-Extraocular movements intact. Sclera/Conjunctiva - Bilateral-No scleral icterus.  Chest and Lung Exam Chest and lung exam reveals -quiet, even and easy respiratory effort with no use of accessory muscles and on auscultation, normal breath sounds, no adventitious sounds and normal vocal resonance. Inspection Chest Wall - Normal. Back - normal.  Breast Breast - Left-Symmetric, Non Tender, No Biopsy scars, no Dimpling - Left, No Inflammation, No Lumpectomy scars, No Mastectomy scars, No Peau d' Orange. Breast - Right-Symmetric, Non Tender, No Biopsy scars, no Dimpling - Right, No Inflammation, No Lumpectomy scars, No Mastectomy scars, No Peau d' Orange. Breast Lump-No Palpable Breast Mass.  Cardiovascular Cardiovascular examination reveals -normal heart sounds, regular rate and rhythm with no murmurs and normal pedal pulses bilaterally.  Abdomen Inspection Inspection of the abdomen reveals - No Hernias. Skin - Scar - no surgical scars. Palpation/Percussion Palpation and Percussion of the abdomen reveal - Soft, Non Tender,  No Rebound tenderness, No Rigidity (guarding) and No hepatosplenomegaly. Auscultation Auscultation of the abdomen reveals - Bowel sounds normal.  Neurologic Neurologic evaluation reveals -alert and oriented x 3 with no impairment of recent or remote memory. Mental Status-Normal.  Musculoskeletal Normal Exam - Left-Upper Extremity Strength Normal and Lower Extremity Strength Normal. Normal Exam - Right-Upper Extremity Strength Normal and Lower Extremity Strength Normal.  Lymphatic Head & Neck  General Head & Neck Lymphatics: Bilateral - Description - Normal. Axillary  General Axillary Region: Bilateral - Description - Normal. Tenderness - Non Tender. Femoral & Inguinal  Generalized Femoral & Inguinal Lymphatics: Bilateral - Description - Normal. Tenderness - Non Tender.    Assessment & Plan (Kumiko Fishman A. Dmitri Pettigrew MD; 07/20/2020 12:54 PM)  ATYPICAL LOBULAR HYPERPLASIA ([facility]) OF BREAST (N60.99) Impression: left breast Recommend left breast seed lumpectomy given potential upgrade risk of these lesions found on core biopsy. Also discussed high risk follow-up in the roll of medications, imaging, micturition, for this problem. She more than likely is at intermediate and potentially high risk but she has no family history. Proceed with left breast lumpectomy and discuss high risk options postoperatively. Risk of lumpectomy include bleeding, infection, seroma, more surgery, use of seed/wire, wound care, cosmetic deformity and the need for other treatments, death , blood clots, death. Pt agrees to proceed.   Total time 45 minutes for face-to-face encounter, examination, chart review, pathology review, imaging review, and documentation.  Current Plans You are being scheduled for surgery- Our schedulers will call you.  You should hear from our office's scheduling department within 5 working days about the location, date, and time of surgery. We try to make accommodations for patient's  preferences in scheduling surgery, but sometimes the OR schedule or the surgeon's schedule prevents [place] from making those accommodations.  If you have not heard from our office [phone]) in 5 working days, call the office and ask for your surgeon's nurse.  If you have other questions about your diagnosis, plan, or surgery, call the office and ask for your surgeon's nurse.  Pt Education - [facility] Breast Cancer Information Given - [facility] "Breast Journey" Package Pt Education - [facility] (After Breast Cancer) Class Info: discussed with patient and provided information.

## 2020-08-04 ENCOUNTER — Other Ambulatory Visit: Payer: Self-pay | Admitting: Surgery

## 2020-08-04 DIAGNOSIS — N6092 Unspecified benign mammary dysplasia of left breast: Secondary | ICD-10-CM

## 2020-08-20 ENCOUNTER — Encounter (HOSPITAL_BASED_OUTPATIENT_CLINIC_OR_DEPARTMENT_OTHER): Payer: Self-pay | Admitting: Surgery

## 2020-08-21 ENCOUNTER — Encounter (HOSPITAL_BASED_OUTPATIENT_CLINIC_OR_DEPARTMENT_OTHER): Payer: Self-pay | Admitting: Surgery

## 2020-08-21 ENCOUNTER — Other Ambulatory Visit: Payer: Self-pay

## 2020-08-24 ENCOUNTER — Other Ambulatory Visit (HOSPITAL_COMMUNITY)
Admission: RE | Admit: 2020-08-24 | Discharge: 2020-08-24 | Disposition: A | Discharge status: Home / Self Care | Source: Ambulatory Visit | Attending: Surgery | Admitting: Surgery

## 2020-08-24 ENCOUNTER — Encounter (HOSPITAL_BASED_OUTPATIENT_CLINIC_OR_DEPARTMENT_OTHER)
Admission: RE | Admit: 2020-08-24 | Discharge: 2020-08-24 | Disposition: A | Discharge status: Home / Self Care | Source: Ambulatory Visit | Attending: Surgery | Admitting: Surgery

## 2020-08-24 DIAGNOSIS — Z01818 Encounter for other preprocedural examination: Secondary | ICD-10-CM | POA: Diagnosis present

## 2020-08-24 DIAGNOSIS — Z20822 Contact with and (suspected) exposure to covid-19: Secondary | ICD-10-CM | POA: Insufficient documentation

## 2020-08-24 DIAGNOSIS — Z01812 Encounter for preprocedural laboratory examination: Secondary | ICD-10-CM | POA: Insufficient documentation

## 2020-08-24 DIAGNOSIS — N6092 Unspecified benign mammary dysplasia of left breast: Secondary | ICD-10-CM | POA: Insufficient documentation

## 2020-08-24 LAB — COMPREHENSIVE METABOLIC PANEL
ALT: 18 U/L (ref 0–44)
AST: 23 U/L (ref 15–41)
Albumin: 4 g/dL (ref 3.5–5.0)
Alkaline Phosphatase: 53 U/L (ref 38–126)
Anion gap: 12 (ref 5–15)
BUN: 16 mg/dL (ref 6–20)
CO2: 25 mmol/L (ref 22–32)
Calcium: 9.3 mg/dL (ref 8.9–10.3)
Chloride: 102 mmol/L (ref 98–111)
Creatinine, Ser: 0.71 mg/dL (ref 0.44–1.00)
GFR, Estimated: >60 mL/min (ref >60)
Glucose, Bld: 84 mg/dL (ref 70–99)
Potassium: 4.2 mmol/L (ref 3.5–5.1)
Sodium: 139 mmol/L (ref 135–145)
Total Bilirubin: 1 mg/dL (ref 0.3–1.2)
Total Protein: 6.7 g/dL (ref 6.5–8.1)

## 2020-08-24 LAB — CBC WITH DIFFERENTIAL/PLATELET
Abs Immature Granulocytes: 0.02 10*3/uL (ref 0.00–0.07)
Basophils Absolute: 0.1 10*3/uL (ref 0.0–0.1)
Basophils Relative: 1 %
Eosinophils Absolute: 0.4 10*3/uL (ref 0.0–0.5)
Eosinophils Relative: 7 %
HCT: 43.3 % (ref 36.0–46.0)
Hemoglobin: 14.2 g/dL (ref 12.0–15.0)
Immature Granulocytes: 0 %
Lymphocytes Relative: 28 %
Lymphs Abs: 1.7 10*3/uL (ref 0.7–4.0)
MCH: 31.5 pg (ref 26.0–34.0)
MCHC: 32.8 g/dL (ref 30.0–36.0)
MCV: 96 fL (ref 80.0–100.0)
Monocytes Absolute: 0.4 10*3/uL (ref 0.1–1.0)
Monocytes Relative: 8 %
Neutro Abs: 3.3 10*3/uL (ref 1.7–7.7)
Neutrophils Relative %: 56 %
Platelets: 256 10*3/uL (ref 150–400)
RBC: 4.51 MIL/uL (ref 3.87–5.11)
RDW: 12.3 % (ref 11.5–15.5)
WBC: 5.9 10*3/uL (ref 4.0–10.5)
nRBC: 0 % (ref 0.0–0.2)

## 2020-08-24 LAB — SARS CORONAVIRUS 2 (TAT 6-24 HRS): SARS Coronavirus 2: NEGATIVE

## 2020-08-24 NOTE — Progress Notes (Signed)

## 2020-08-26 ENCOUNTER — Other Ambulatory Visit: Payer: Self-pay | Admitting: Surgery

## 2020-08-26 ENCOUNTER — Ambulatory Visit
Admission: RE | Admit: 2020-08-26 | Discharge: 2020-08-26 | Disposition: A | Discharge status: Home / Self Care | Source: Ambulatory Visit | Attending: Surgery | Admitting: Surgery

## 2020-08-26 ENCOUNTER — Other Ambulatory Visit: Payer: Self-pay

## 2020-08-26 DIAGNOSIS — N6092 Unspecified benign mammary dysplasia of left breast: Secondary | ICD-10-CM

## 2020-08-27 ENCOUNTER — Encounter

## 2020-08-27 ENCOUNTER — Other Ambulatory Visit: Payer: Self-pay

## 2020-08-27 ENCOUNTER — Encounter (HOSPITAL_BASED_OUTPATIENT_CLINIC_OR_DEPARTMENT_OTHER)
Admission: RE | Disposition: A | Discharge status: Home / Self Care | Payer: Self-pay | Source: Home / Self Care | Attending: Surgery

## 2020-08-27 ENCOUNTER — Ambulatory Visit (HOSPITAL_BASED_OUTPATIENT_CLINIC_OR_DEPARTMENT_OTHER)
Admission: RE | Admit: 2020-08-27 | Discharge: 2020-08-27 | Disposition: A | Discharge status: Home / Self Care | Attending: Surgery | Admitting: Surgery

## 2020-08-27 ENCOUNTER — Ambulatory Visit
Admission: RE | Admit: 2020-08-27 | Discharge: 2020-08-27 | Disposition: A | Discharge status: Home / Self Care | Source: Ambulatory Visit | Attending: Surgery | Admitting: Surgery

## 2020-08-27 ENCOUNTER — Encounter (HOSPITAL_BASED_OUTPATIENT_CLINIC_OR_DEPARTMENT_OTHER): Payer: Self-pay | Admitting: Surgery

## 2020-08-27 ENCOUNTER — Ambulatory Visit (HOSPITAL_BASED_OUTPATIENT_CLINIC_OR_DEPARTMENT_OTHER): Admitting: Certified Registered"

## 2020-08-27 DIAGNOSIS — N6489 Other specified disorders of breast: Secondary | ICD-10-CM | POA: Insufficient documentation

## 2020-08-27 DIAGNOSIS — I10 Essential (primary) hypertension: Secondary | ICD-10-CM | POA: Insufficient documentation

## 2020-08-27 DIAGNOSIS — N6092 Unspecified benign mammary dysplasia of left breast: Secondary | ICD-10-CM

## 2020-08-27 DIAGNOSIS — Z79890 Hormone replacement therapy (postmenopausal): Secondary | ICD-10-CM | POA: Diagnosis not present

## 2020-08-27 DIAGNOSIS — Z882 Allergy status to sulfonamides status: Secondary | ICD-10-CM | POA: Insufficient documentation

## 2020-08-27 DIAGNOSIS — Z79899 Other long term (current) drug therapy: Secondary | ICD-10-CM | POA: Insufficient documentation

## 2020-08-27 DIAGNOSIS — Z888 Allergy status to other drugs, medicaments and biological substances status: Secondary | ICD-10-CM | POA: Insufficient documentation

## 2020-08-27 DIAGNOSIS — F419 Anxiety disorder, unspecified: Secondary | ICD-10-CM | POA: Insufficient documentation

## 2020-08-27 HISTORY — DX: Adverse effect of skeletal muscle relaxants (neuromuscular blocking agents), initial encounter: T48.1X5A

## 2020-08-27 HISTORY — DX: Unspecified osteoarthritis, unspecified site: M19.90

## 2020-08-27 HISTORY — DX: Anxiety disorder, unspecified: F41.9

## 2020-08-27 HISTORY — DX: Unspecified benign mammary dysplasia of left breast: N60.92

## 2020-08-27 HISTORY — PX: BREAST LUMPECTOMY WITH RADIOACTIVE SEED LOCALIZATION: SHX6424

## 2020-08-27 SURGERY — BREAST LUMPECTOMY WITH RADIOACTIVE SEED LOCALIZATION
Anesthesia: General | Site: Breast | Laterality: Left

## 2020-08-27 MED ORDER — ACETAMINOPHEN 500 MG PO TABS
ORAL_TABLET | ORAL | Status: AC
  Filled 2020-08-27: qty 2

## 2020-08-27 MED ORDER — LIDOCAINE 2% (20 MG/ML) 5 ML SYRINGE
INTRAMUSCULAR | Status: AC
  Filled 2020-08-27: qty 5

## 2020-08-27 MED ORDER — CEFAZOLIN SODIUM-DEXTROSE 2-4 GM/100ML-% IV SOLN
INTRAVENOUS | Status: AC
  Filled 2020-08-27: qty 100

## 2020-08-27 MED ORDER — DEXAMETHASONE SODIUM PHOSPHATE 10 MG/ML IJ SOLN
INTRAMUSCULAR | Status: DC | PRN
  Administered 2020-08-27: 4 mg via INTRAVENOUS

## 2020-08-27 MED ORDER — HYDROMORPHONE HCL 1 MG/ML IJ SOLN: 0.2500 mg | INTRAMUSCULAR | Status: DC | PRN

## 2020-08-27 MED ORDER — HYDROCODONE-ACETAMINOPHEN 5-325 MG PO TABS: 1.0000 | ORAL_TABLET | Freq: Four times a day (QID) | ORAL | 0 refills | Status: DC | PRN

## 2020-08-27 MED ORDER — PROPOFOL 500 MG/50ML IV EMUL
INTRAVENOUS | Status: DC | PRN
  Administered 2020-08-27: 175 ug/kg/min via INTRAVENOUS

## 2020-08-27 MED ORDER — PROPOFOL 500 MG/50ML IV EMUL
INTRAVENOUS | Status: AC
  Filled 2020-08-27: qty 100

## 2020-08-27 MED ORDER — ONDANSETRON HCL 4 MG/2ML IJ SOLN
INTRAMUSCULAR | Status: AC
  Filled 2020-08-27: qty 2

## 2020-08-27 MED ORDER — BUPIVACAINE-EPINEPHRINE (PF) 0.25% -1:200000 IJ SOLN
INTRAMUSCULAR | Status: DC | PRN
  Administered 2020-08-27: 20 mL

## 2020-08-27 MED ORDER — LACTATED RINGERS IV SOLN: INTRAVENOUS | Status: DC

## 2020-08-27 MED ORDER — FENTANYL CITRATE (PF) 100 MCG/2ML IJ SOLN
INTRAMUSCULAR | Status: AC
  Filled 2020-08-27: qty 2

## 2020-08-27 MED ORDER — ONDANSETRON HCL 4 MG/2ML IJ SOLN
INTRAMUSCULAR | Status: DC | PRN
  Administered 2020-08-27: 4 mg via INTRAVENOUS

## 2020-08-27 MED ORDER — PROPOFOL 10 MG/ML IV BOLUS
INTRAVENOUS | Status: DC | PRN
  Administered 2020-08-27: 150 mg via INTRAVENOUS
  Administered 2020-08-27: 50 mg via INTRAVENOUS

## 2020-08-27 MED ORDER — FENTANYL CITRATE (PF) 100 MCG/2ML IJ SOLN
INTRAMUSCULAR | Status: DC | PRN
  Administered 2020-08-27: 50 ug via INTRAVENOUS
  Administered 2020-08-27 (×2): 25 ug via INTRAVENOUS

## 2020-08-27 MED ORDER — PROPOFOL 10 MG/ML IV BOLUS
INTRAVENOUS | Status: AC
  Filled 2020-08-27: qty 20

## 2020-08-27 MED ORDER — CEFAZOLIN SODIUM-DEXTROSE 2-4 GM/100ML-% IV SOLN
2.0000 g | INTRAVENOUS | Status: AC
  Administered 2020-08-27: 2 g via INTRAVENOUS

## 2020-08-27 MED ORDER — LIDOCAINE 2% (20 MG/ML) 5 ML SYRINGE
INTRAMUSCULAR | Status: DC | PRN
  Administered 2020-08-27: 60 mg via INTRAVENOUS

## 2020-08-27 MED ORDER — CHLORHEXIDINE GLUCONATE CLOTH 2 % EX PADS: 6.0000 | MEDICATED_PAD | Freq: Once | CUTANEOUS | Status: DC

## 2020-08-27 MED ORDER — IBUPROFEN 800 MG PO TABS: 800.0000 mg | ORAL_TABLET | Freq: Three times a day (TID) | ORAL | 0 refills | Status: DC | PRN

## 2020-08-27 MED ORDER — ACETAMINOPHEN 500 MG PO TABS
1000.0000 mg | ORAL_TABLET | ORAL | Status: AC
  Administered 2020-08-27: 1000 mg via ORAL

## 2020-08-27 MED ORDER — DEXAMETHASONE SODIUM PHOSPHATE 10 MG/ML IJ SOLN
INTRAMUSCULAR | Status: AC
  Filled 2020-08-27: qty 1

## 2020-08-27 SURGICAL SUPPLY — 42 items
ADH SKN CLS APL DERMABOND .7 (GAUZE/BANDAGES/DRESSINGS) ×1
APL PRP STRL LF DISP 70% ISPRP (MISCELLANEOUS) ×1
BINDER BREAST LRG (GAUZE/BANDAGES/DRESSINGS) ×2 IMPLANT
BLADE SURG 15 STRL LF DISP TIS (BLADE) ×1 IMPLANT
BLADE SURG 15 STRL SS (BLADE) ×2
CANISTER SUC SOCK COL 7IN (MISCELLANEOUS) ×2 IMPLANT
CANISTER SUCT 1200ML W/VALVE (MISCELLANEOUS) ×2 IMPLANT
CHLORAPREP W/TINT 26 (MISCELLANEOUS) ×2 IMPLANT
COVER BACK TABLE 60X90IN (DRAPES) ×2 IMPLANT
COVER MAYO STAND STRL (DRAPES) ×2 IMPLANT
COVER PROBE W GEL 5X96 (DRAPES) ×2 IMPLANT
DECANTER SPIKE VIAL GLASS SM (MISCELLANEOUS) IMPLANT
DERMABOND ADVANCED (GAUZE/BANDAGES/DRESSINGS) ×1
DERMABOND ADVANCED .7 DNX12 (GAUZE/BANDAGES/DRESSINGS) ×1 IMPLANT
DRAPE LAPAROTOMY 100X72 PEDS (DRAPES) ×2 IMPLANT
DRAPE UTILITY XL STRL (DRAPES) ×2 IMPLANT
ELECT COATED BLADE 2.86 ST (ELECTRODE) ×2 IMPLANT
ELECT REM PT RETURN 9FT ADLT (ELECTROSURGICAL) ×2
ELECTRODE REM PT RTRN 9FT ADLT (ELECTROSURGICAL) ×1 IMPLANT
GLOVE BIO SURGEON STRL SZ 6.5 (GLOVE) ×2 IMPLANT
GLOVE ECLIPSE 8.0 STRL XLNG CF (GLOVE) ×2 IMPLANT
GLOVE SRG 8 PF TXTR STRL LF DI (GLOVE) ×1 IMPLANT
GLOVE SURG UNDER POLY LF SZ8 (GLOVE) ×2
GOWN STRL REUS W/ TWL LRG LVL3 (GOWN DISPOSABLE) ×2 IMPLANT
GOWN STRL REUS W/ TWL XL LVL3 (GOWN DISPOSABLE) ×1 IMPLANT
GOWN STRL REUS W/TWL LRG LVL3 (GOWN DISPOSABLE) ×4
GOWN STRL REUS W/TWL XL LVL3 (GOWN DISPOSABLE) ×2
KIT MARKER MARGIN INK (KITS) ×2 IMPLANT
NEEDLE HYPO 25X1 1.5 SAFETY (NEEDLE) ×2 IMPLANT
NS IRRIG 1000ML POUR BTL (IV SOLUTION) ×2 IMPLANT
PACK BASIN DAY SURGERY FS (CUSTOM PROCEDURE TRAY) ×2 IMPLANT
PENCIL SMOKE EVACUATOR (MISCELLANEOUS) ×2 IMPLANT
SLEEVE SCD COMPRESS KNEE MED (MISCELLANEOUS) ×2 IMPLANT
SPONGE LAP 4X18 RFD (DISPOSABLE) ×2 IMPLANT
SUT MNCRL AB 4-0 PS2 18 (SUTURE) ×2 IMPLANT
SUT SILK 2 0 SH (SUTURE) IMPLANT
SUT VICRYL 3-0 CR8 SH (SUTURE) ×2 IMPLANT
SYR CONTROL 10ML LL (SYRINGE) ×2 IMPLANT
TOWEL GREEN STERILE FF (TOWEL DISPOSABLE) ×2 IMPLANT
TRAY FAXITRON CT DISP (TRAY / TRAY PROCEDURE) ×2 IMPLANT
TUBE CONNECTING 20X1/4 (TUBING) ×2 IMPLANT
YANKAUER SUCT BULB TIP NO VENT (SUCTIONS) ×2 IMPLANT

## 2020-08-27 NOTE — Discharge Instructions (Signed)
  Post Anesthesia Home Care Instructions  Activity: Get plenty of rest for the remainder of the day. A responsible individual must stay with you for 24 hours following the procedure.  For the next 24 hours, DO NOT: -Drive a car -Advertising copywriter -Drink alcoholic beverages -Take any medication unless instructed by your physician -Make any legal decisions or sign important papers.  Meals: Start with liquid foods such as gelatin or soup. Progress to regular foods as tolerated. Avoid greasy, spicy, heavy foods. If nausea and/or vomiting occur, drink only clear liquids until the nausea and/or vomiting subsides. Call your physician if vomiting continues.  Special Instructions/Symptoms: Your throat may feel dry or sore from the anesthesia or the breathing tube placed in your throat during surgery. If this causes discomfort, gargle with warm salt water. The discomfort should disappear within 24 hours.  If you had a scopolamine patch placed behind your ear for the management of post- operative nausea and/or vomiting:  1. The medication in the patch is effective for 72 hours, after which it should be removed.  Wrap patch in a tissue and discard in the trash. Wash hands thoroughly with soap and water. 2. You may remove the patch earlier than 72 hours if you experience unpleasant side effects which may include dry mouth, dizziness or visual disturbances. 3. Avoid touching the patch. Wash your hands with soap and water after contact with the patch.    NO TYLENOL BEFORE 4pm, IF NEEDED.  No shower until 2pm on day after surgery.  Incision has skin glue (dermabond).  Try to keep dry.  Pat dry after shower.  Keep breast binder on for 72hrs. You can continue to wear it until your follow up appointment or you may wear a sports bra.  If you develop a fever greater that 100.4 or have any concerns of infection, you should call the surgeon.

## 2020-08-27 NOTE — Op Note (Signed)
Preoperative diagnosis: Left breast atypical lobular hyperplasia  Postoperative diagnosis: Same  Procedure: Left breast seed localized lumpectomy using 2 seeds to bracket lesion  Surgeon: Erroll Luna, MD  Anesthesia: LMA with 0.2 5% Marcaine with epinephrine  Drains: None  Specimen: Left breast tissue with localizing clip and seed verified by Faxitron.  The second seed was sent independently of the mass.  Additional inferior, deep and medial margins were taken and sent separately.  Of note only one clip was retrieved with a second clip more likely being extruded out of the operative field.  Lesion of interest that was removed.   IV fluids: Per anesthesia record  Indications for procedure: The patient presents for left breast lumpectomy for atypical lobular hyperplasia detected on mammography due to the microcalcifications.  She had 2 areas biopsied one showed focal adenosis second showing similar pathology but with atypical lobular hyperplasia noted more in the upper outer quadrant.  The area was bracketed with 2 seeds from the upper outer quadrant just down to behind the nipple areolar complex.  There were 2 clips but the mass was palpable that was biopsied and was biopsied with his anterior and posterior extent defined with 2 clips.  We opted to place 2 seeds around it just a better excise the area for further pathological evaluation.The procedure has been discussed with the patient. Alternatives to surgery have been discussed with the patient.  Risks of surgery include bleeding,  Infection,  Seroma formation, death,  and the need for further surgery.   The patient understands and wishes to proceed.   Description of procedure: The patient was met in the holding area.  Neoprobe used to identify both seeds left breast upper outer quadrant.  All questions were answered.  She was taken back to the operative room.  She is placed supine upon the OR table.  After induction of general anesthesia,  left breast was prepped and draped in a sterile fashion and timeout performed.  Proper patient, site and procedure were verified.  Films were available for review.  Neoprobe was used to identify the seed location left breast upper outer quadrant.  Incision made over the location.  Dissection was carried down and the superior seed and clip were identified the neoprobe.  We dissected toward the nipple areolar complex to excise this large rubbery-like mass.  The second seed was then for the neoprobe but had been extruded from the tissue due to manipulation was placed in a separate container.  The remainder this area was removed.  The Faxitron revealed the seed and clip to be present.  The X shaped clip was the inferior border of this.  I went ahead and excised the deep margin, medial margin, and inferior margin which is the area where the seed and clip were located.  Despite excising these areas there is no evidence of the X clip.  At this point I felt that the area is well excised given the mammographic findings and clip and seed location.  Irrigation was used.  Hemostasis was achieved.  Specimen was oriented with ink and sent to pathology.  Films reviewed with radiologist.  We then closed the cavity with 3-0 Vicryl.  4-0 Monocryl was used to close the skin in a subcuticular fashion.  Dermabond applied.  All counts were found to be correct.  Breast binder placed.  Patient awoke extubated taken to recovery in satisfactory condition.

## 2020-08-27 NOTE — H&P (Signed)
Valentina Shaggy Appointment: 07/20/2020 9:40 AM Location: [facility] Patient #: [id number] DOB: 12/15/1960 Married / Language: [place] / Race: White Female  History of Present Illness [name] Vikash Nest MD; 112:54 PM) Patient words: Patient sent at the request of the [facility] [place] Dr. Manson Passey after recent screening mammogram revealed what appeared to be left breast calcifications. She has a history of left breast biopsy in 2010 for calcifications and was found to have fibrocystic type changes. There is a 2 cm cluster of pleomorphic calcifications upper outer quadrant left breast core biopsy showed atypical lobular hyperplasia and signs of fibrocystic type changes. Patient denies any history of breast pain, nipple discharge or recent breast mass bilaterally.       Patient returns after screening study for evaluation of possible LEFT breast calcifications. History of benign excisional biopsy of LEFT breast calcifications in 2010.  EXAM: DIGITAL DIAGNOSTIC LEFT MAMMOGRAM WITH CAD  COMPARISON: Previous exam(s).  ACR Breast Density Category c: The breast tissue is heterogeneously dense, which may obscure small masses.  FINDINGS: Magnified views are performed of calcifications in the UPPER-OUTER QUADRANT of the LEFT breast. In this region there is a group of faint pleomorphic calcifications spanning 2.2 x 0.7 x 2.0 centimeters. Calcifications do not layer on the true LATERAL projection.  Mammographic images were processed with CAD.  IMPRESSION: Indeterminate LEFT breast calcifications spanning 2.2 centimeters.  RECOMMENDATION: Recommend stereotactic guided core biopsy of anterior and posterior components of LEFT breast calcifications.  I have discussed the findings and recommendations with the patient. If applicable, a reminder letter will be sent to the patient regarding the next appointment.  BI-RADS CATEGORY 4:  Suspicious.   Electronically Signed By: Norva Pavlov M.D. On: 07/03/2020 11:28              Diagnosis 1. Breast, left, needle core biopsy, upper outer - FOCAL LOBULAR NEOPLASIA (ATYPICAL LOBULAR HYPERPLASIA) - ADENOSIS AND FIBROCYSTIC CHANGES WITH CALCIFICATIONS - PSEUDOANGIOMATOUS STROMAL HYPERPLASIA 2. Breast, left, needle core biopsy, upper outer - ADENOSIS AND FIBROCYSTIC CHANGES WITH APOCRINE METAPLASIA - DUCT ECTASIA - PSEUDOANGIOMATOUS STROMAL HYPERPLASIA - NO MALIGNANCY IDENTIFIED.  The patient is a 59 year old female.   Past Surgical History (Tanisha A. Manson Passey, DGU44 AM) Breast Biopsy Bilateral. multiple Colon Polyp Removal - Colonoscopy Oral Surgery  Diagnostic Studies History (Tanisha A. Manson Passey, RMA9:37 AM) Colonoscopy within last year Mammogram within last year Pap Smear 1-5 years ago  Allergies (Tanisha A. Manson Passey, RMA; Sulfa Antibiotics Succinylcholine Chloride *NEUROMUSCULAR AGENTS* Allergies Reconciled  Medication History (Tanisha A. Manson Passey, RMA;  Progesterone (100MG  Capsule, Oral) Active. Levocetirizine Dihydrochloride (5MG  Tablet, Oral) Active. Triamterene-HCTZ (37.5-25MG  Tablet, Oral) Active. Estradiol (0.1MG  Patch Weekly, Transdermal) Active. busPIRone HCl (7.5MG  Tablet, Oral) Active. Multi-Day (Oral) Active. Calcium Citrate (Oral) Specific strength unknown - Active. Omega 3 (Oral) Specific strength unknown - Active. GNP Glucosame Maximum Strength (1000MG  Tablet, Oral) Active. Medications Reconciled  Social History (Tanisha A. , RMA Alcohol use Occasional alcohol use. Caffeine use Coffee, Tea. No drug use Tobacco use Never smoker.  Family History (Tanisha A. , RMA; 07/20/2020 9:37 AM) Anesthetic complications Mother, Sister. Arthritis Mother. Cerebrovascular Accident Father. Diabetes Mellitus Mother. Heart Disease Father. Hypertension Brother, Family Members In  [place], Father.  Pregnancy / Birth History (Tanisha A. Manson Passey, RM7 AM) Age at menarche 17 years. Age of menopause 51-55 Gravida 0 Para 0  Other Problems (Tanisha A. Brown, RMA; 21 9:37 AM) Anxiety Disorder General anesthesia - complications High blood pressure Lump In Breast     Review  of Systems (Tanisha A. Manson Passey RMA; M) General Not Present- Appetite Loss, Chills, Fatigue, Fever, Night Sweats, Weight Gain and Weight Loss. Skin Not Present- Change in Wart/Mole, Dryness, Hives, Jaundice, New Lesions, Non-Healing Wounds, Rash and Ulcer. HEENT Not Present- Earache, Hearing Loss, Hoarseness, Nose Bleed, Oral Ulcers, Ringing in the Ears, Seasonal Allergies, Sinus Pain, Sore Throat, Visual Disturbances, Wears glasses/contact lenses and Yellow Eyes. Respiratory Not Present- Bloody sputum, Chronic Cough, Difficulty Breathing, Snoring and Wheezing. Breast Present- Breast Mass. Not Present- Breast Pain, Nipple Discharge and Skin Changes. Cardiovascular Not Present- Chest Pain, Difficulty Breathing Lying Down, Leg Cramps, Palpitations, Rapid Heart Rate, Shortness of Breath and Swelling of Extremities. Gastrointestinal Not Present- Abdominal Pain, Bloating, Bloody Stool, Change in Bowel Habits, Chronic diarrhea, Constipation, Difficulty Swallowing, Excessive gas, Gets full quickly at meals, Hemorrhoids, Indigestion, Nausea, Rectal Pain and Vomiting. Female Genitourinary Not Present- Frequency, Nocturia, Painful Urination, Pelvic Pain and Urgency. Musculoskeletal Not Present- Back Pain, Joint Pain, Joint Stiffness, Muscle Pain, Muscle Weakness and Swelling of Extremities. Neurological Not Present- Decreased Memory, Fainting, Headaches, Numbness, Seizures, Tingling, Tremor, Trouble walking and Weakness. Psychiatric Not Present- Anxiety, Bipolar, Change in Sleep Pattern, Depression, Fearful and Frequent crying. Endocrine Not Present- Cold Intolerance, Excessive Hunger, Hair Changes, Heat  Intolerance, Hot flashes and New Diabetes. Hematology Not Present- Blood Thinners, Easy Bruising, Excessive bleeding, Gland problems, HIV and Persistent Infections.  Vitals (07/20/2020 9:41 AM Weight: 137.4 lb Height: 70in Body Surface Area: 1.78 m Body Mass Index: 19.71 kg/m  Temp.: 98.34F  Pulse: 89 (Regular)  BP: 122/76(Sitting, Left Arm, Standard)        Physical Exam  General Mental Status-Alert. General Appearance-Consistent with stated age. Hydration-Well hydrated. Voice-Normal.  Head and Neck Head-normocephalic, atraumatic with no lesions or palpable masses. Trachea-midline. Thyroid Gland Characteristics - normal size and consistency.  Eye Eyeball - Bilateral-Extraocular movements intact. Sclera/Conjunctiva - Bilateral-No scleral icterus.  Chest and Lung Exam Chest and lung exam reveals -quiet, even and easy respiratory effort with no use of accessory muscles and on auscultation, normal breath sounds, no adventitious sounds and normal vocal resonance. Inspection Chest Wall - Normal. Back - normal.  Breast Breast - Left-Symmetric, Non Tender, No Biopsy scars, no Dimpling - Left, No Inflammation, No Lumpectomy scars, No Mastectomy scars, No Peau d' Orange. Breast - Right-Symmetric, Non Tender, No Biopsy scars, no Dimpling - Right, No Inflammation, No Lumpectomy scars, No Mastectomy scars, No Peau d' Orange. Breast Lump-No Palpable Breast Mass.  Cardiovascular Cardiovascular examination reveals -normal heart sounds, regular rate and rhythm with no murmurs and normal pedal pulses bilaterally.  Abdomen Inspection Inspection of the abdomen reveals - No Hernias. Skin - Scar - no surgical scars. Palpation/Percussion Palpation and Percussion of the abdomen reveal - Soft, Non Tender, No Rebound tenderness, No Rigidity (guarding) and No hepatosplenomegaly. Auscultation Auscultation of the abdomen reveals - Bowel  sounds normal.  Neurologic Neurologic evaluation reveals -alert and oriented x 3 with no impairment of recent or remote memory. Mental Status-Normal.  Musculoskeletal Normal Exam - Left-Upper Extremity Strength Normal and Lower Extremity Strength Normal. Normal Exam - Right-Upper Extremity Strength Normal and Lower Extremity Strength Normal.  Lymphatic Head & Neck  General Head & Neck Lymphatics: Bilateral - Description - Normal. Axillary  General Axillary Region: Bilateral - Description - Normal. Tenderness - Non Tender. Femoral & Inguinal  Generalized Femoral & Inguinal Lymphatics: Bilateral - Description - Normal. Tenderness - Non Tender.    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 Also discussed high risk follow-up in the roll of medications, imaging, micturition, for this problem. She more than likely is at intermediate and potentially high risk but she has no family history. Proceed with left breast lumpectomy and discuss high risk options postoperatively. Risk of lumpectomy include bleeding, infection, seroma, more surgery, use of seed/wire, wound care, cosmetic deformity and the need for other treatments, death , blood clots, death. Pt agrees to proceed.   Total time 45 minutes for face-to-face encounter, examination, chart review, pathology review, imaging review, and documentation.  Current Plans You are being scheduled for surgery- Our schedulers will call you.  You should hear from our office's scheduling department within 5 working days about the location, date, and time of surgery. We try to make accommodations for patient's preferences in scheduling surgery, but sometimes the OR schedule or the surgeon's schedule prevents [place] from making those accommodations.  If you have not  heard from our office [phone]) in 5 working days, call the office and ask for your surgeon's nurse.  If you have other questions about your diagnosis, plan, or surgery, call the office and ask for your surgeon's nurse.  Pt Education - [facility] Breast Cancer Information Given - [facility] "Breast Journey" Package Pt Education - [facility] (After Breast Cancer) Class Info: discussed with patient and provided information.

## 2020-08-27 NOTE — Anesthesia Preprocedure Evaluation (Addendum)
Anesthesia Evaluation  Patient identified by MRN, date of birth, ID band Patient awake    Reviewed: Allergy & Precautions, H&P , NPO status , Patient's Chart, lab work & pertinent test results  History of Anesthesia Complications (+) MALIGNANT HYPERTHERMIA and Family history of anesthesia reaction  Airway Mallampati: III  TM Distance: >3 FB Neck ROM: Full    Dental no notable dental hx. (+) Teeth Intact, Dental Advisory Given   Pulmonary neg pulmonary ROS,    Pulmonary exam normal breath sounds clear to auscultation       Cardiovascular hypertension, Pt. on medications  Rhythm:Regular Rate:Normal     Neuro/Psych Anxiety negative neurological ROS     GI/Hepatic negative GI ROS, Neg liver ROS,   Endo/Other  negative endocrine ROS  Renal/GU negative Renal ROS  negative genitourinary   Musculoskeletal  (+) Arthritis ,   Abdominal   Peds  Hematology negative hematology ROS (+)   Anesthesia Other Findings   Reproductive/Obstetrics negative OB ROS                            Anesthesia Physical Anesthesia Plan  ASA: II  Anesthesia Plan: General   Post-op Pain Management:    Induction: Intravenous  PONV Risk Score and Plan: 4 or greater and Ondansetron, Dexamethasone and Midazolam  Airway Management Planned: LMA  Additional Equipment:   Intra-op Plan:   Post-operative Plan: Extubation in OR  Informed Consent: I have reviewed the patients History and Physical, chart, labs and discussed the procedure including the risks, benefits and alternatives for the proposed anesthesia with the patient or authorized representative who has indicated his/her understanding and acceptance.     Dental advisory given  Plan Discussed with: CRNA  Anesthesia Plan Comments:         Anesthesia Quick Evaluation

## 2020-08-27 NOTE — Interval H&P Note (Signed)
History and Physical Interval Note:  08/27/2020 12:06 PM  Gregery Na  has presented today for surgery, with the diagnosis of LEFT BREAST ATYPICAL LOBULAR HYPER PLASIA.  The various methods of treatment have been discussed with the patient and family. After consideration of risks, benefits and other options for treatment, the patient has consented to  Procedure(s): LEFT BREAST LUMPECTOMY WITH RADIOACTIVE SEED LOCALIZATION (Left) as a surgical intervention.  The patient's history has been reviewed, patient examined, no change in status, stable for surgery.  I have reviewed the patient's chart and labs.  Questions were answered to the patient's satisfaction.     Dortha Schwalbe MD

## 2020-08-27 NOTE — Anesthesia Procedure Notes (Signed)
Procedure Name: LMA Insertion Date/Time: 08/27/2020 12:44 PM Performed by: Nelle Don, CRNA Pre-anesthesia Checklist: Patient identified, Emergency Drugs available, Suction available and Patient being monitored Patient Re-evaluated:Patient Re-evaluated prior to induction Oxygen Delivery Method: Circle system utilized Preoxygenation: Pre-oxygenation with 100% oxygen Induction Type: IV induction LMA: LMA inserted LMA Size: 4.0 Number of attempts: 1 Dental Injury: Teeth and Oropharynx as per pre-operative assessment

## 2020-08-27 NOTE — Transfer of Care (Signed)
Immediate Anesthesia Transfer of Care Note  Patient: DAILYNN Robbins  Procedure(s) Performed: LEFT BREAST LUMPECTOMY WITH RADIOACTIVE SEED LOCALIZATION (Left Breast)  Patient Location: PACU  Anesthesia Type:General  Level of Consciousness: awake, alert  and oriented  Airway & Oxygen Therapy: Patient Spontanous Breathing and Patient connected to face mask oxygen  Post-op Assessment: Report given to RN, Post -op Vital signs reviewed and stable and Patient moving all extremities X 4  Post vital signs: Reviewed and stable  Last Vitals:  Vitals Value Taken Time  BP    Temp    Pulse 65 08/27/20 1341  Resp 11 08/27/20 1341  SpO2 85 % 08/27/20 1341  Vitals shown include unvalidated device data.  Last Pain:  Vitals:   08/27/20 0949  TempSrc: Oral  PainSc: 0-No pain      Patients Stated Pain Goal: 4 (08/27/20 0949)  Complications: No complications documented.

## 2020-08-27 NOTE — Anesthesia Postprocedure Evaluation (Signed)
Anesthesia Post Note  Patient: Jill Robbins  Procedure(s) Performed: LEFT BREAST LUMPECTOMY WITH RADIOACTIVE SEED LOCALIZATION (Left Breast)     Patient location during evaluation: PACU Anesthesia Type: General Level of consciousness: awake and alert Pain management: pain level controlled Vital Signs Assessment: post-procedure vital signs reviewed and stable Respiratory status: spontaneous breathing, nonlabored ventilation and respiratory function stable Cardiovascular status: blood pressure returned to baseline and stable Postop Assessment: no apparent nausea or vomiting Anesthetic complications: no   No complications documented.  Last Vitals:  Vitals:   08/27/20 1409 08/27/20 1419  BP:  121/79  Pulse: (!) 54 (!) 56  Resp: 13 14  Temp:  36.6 C  SpO2: 100% 100%    Last Pain:  Vitals:   08/27/20 1419  TempSrc:   PainSc: 4                  Chantelle Verdi,W. EDMOND

## 2020-08-28 ENCOUNTER — Encounter (HOSPITAL_BASED_OUTPATIENT_CLINIC_OR_DEPARTMENT_OTHER): Payer: Self-pay | Admitting: Surgery

## 2020-09-02 LAB — SURGICAL PATHOLOGY

## 2020-10-15 ENCOUNTER — Telehealth: Payer: Self-pay

## 2020-10-15 NOTE — Telephone Encounter (Signed)
The labs have to be associated with an office visit, I typically order them at the time and they may either get them that day or come back if they prefer to our lab or any [facility] location.

## 2020-10-15 NOTE — Telephone Encounter (Signed)
Copied from CRM [id number]. Topic: General - Inquiry >> Oct 15, 2020 11:16 AM Adrian Prince D wrote: Reason for CRM: Patient would like to come in for labs prior to her appointment on 10-23-2020. She would like to come in tomorrow to have the labs drawn. She can be reached at [phone]. Please advice

## 2020-10-19 NOTE — Telephone Encounter (Signed)
Patient was advised and states that someone had already told her.

## 2020-10-23 ENCOUNTER — Ambulatory Visit (INDEPENDENT_AMBULATORY_CARE_PROVIDER_SITE_OTHER): Admitting: Physician Assistant

## 2020-10-23 ENCOUNTER — Other Ambulatory Visit: Payer: Self-pay

## 2020-10-23 ENCOUNTER — Encounter: Payer: Self-pay | Admitting: Physician Assistant

## 2020-10-23 VITALS — BP 118/88 | HR 79 | Temp 98.1°F | Ht 70.0 in | Wt 140.2 lb

## 2020-10-23 DIAGNOSIS — N6092 Unspecified benign mammary dysplasia of left breast: Secondary | ICD-10-CM | POA: Insufficient documentation

## 2020-10-23 DIAGNOSIS — F419 Anxiety disorder, unspecified: Secondary | ICD-10-CM | POA: Diagnosis not present

## 2020-10-23 DIAGNOSIS — Z0000 Encounter for general adult medical examination without abnormal findings: Secondary | ICD-10-CM

## 2020-10-23 MED ORDER — BUSPIRONE HCL 7.5 MG PO TABS: 7.5000 mg | ORAL_TABLET | Freq: Two times a day (BID) | ORAL | 3 refills | Status: DC

## 2020-10-23 NOTE — Progress Notes (Signed)
Complete physical exam   Patient: Jill Robbins   DOB: [birth year]   60 y.o. Female  MRN: [id number] Visit Date: 10/23/2020  Today's healthcare provider: Trey Sailors, PA-C   Chief Complaint  Patient presents with  . Annual Exam  I,Rowyn Mustapha M Namon Villarin,acting as a scribe for Trey Sailors, PA-C.,have documented all relevant documentation on the behalf of Trey Sailors, PA-C,as directed by  Trey Sailors, PA-C while in the presence of Trey Sailors, PA-C.  Subjective    Jill Robbins is a 60 y.o. female who presents today for a complete physical exam.  She reports consuming a general diet. Home exercise routine includes walking. She generally feels well. She reports sleeping poorly, due to only getting 5 hours of sleep a night. She reports waking up in the middle of the night.  She does have additional problems to discuss today.  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 She underwent lumpectomy in 08/2020. She is to undergo yearly mammogram and breast MRI arranged by her breast surgeon.   Anxiety, Follow-up  She was last seen for anxiety 1 years ago. Changes made at last visit include continue buspar 7.5 mg BID. She had tried to come off buspar and reports an increase in anxiety after doing so.    She reports excellent compliance with treatment. She reports excellent tolerance of treatment. She is not having side effects.   She feels her anxiety is mild and Improved since last visit.  Symptoms: No chest pain No difficulty concentrating  No dizziness No fatigue  No feelings of losing control No insomnia  No irritable No palpitations  No panic attacks No racing thoughts  No shortness of breath No sweating  No tremors/shakes    GAD-7 Results GAD-7 Generalized Anxiety Disorder Screening Tool 03/08/2019 07/13/2018  1. Feeling Nervous, Anxious, or on Edge 3 3  2. Not Being Able to Stop or Control Worrying 3 3  3.  Worrying Too Much About Different Things 3 3  4. Trouble Relaxing 1 3  5. Being So Restless it's Hard To Sit Still 0 3  6. Becoming Easily Annoyed or Irritable 1 3  7. Feeling Afraid As If Something Awful Might Happen 2 3  Total GAD-7 Score 13 21  Difficulty At Work, Home, or Getting  Along With Others? Somewhat difficult Very difficult    PHQ-9 Scores PHQ9 SCORE ONLY 10/23/2020 03/08/2019 08/25/2017  PHQ-9 Total Score 3 3 0    ---------------------------------------------------------------------------------------------------  Past Medical History:  Diagnosis Date  . Anxiety   . Arthritis    fingers  . Atypical hyperplasia of left breast   . 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 Family history of adverse reaction to anesthesia    uncle conplications with surgery, then family was all tested  . HTN (hypertension)   . Osteopenia   . Succinylcholine adverse reaction    Past Surgical History:  Procedure Laterality Date  . adnoids     as a child  . BREAST BIOPSY Right 12/08/2017  . BREAST BIOPSY Left   . BREAST LUMPECTOMY WITH RADIOACTIVE SEED LOCALIZATION Left 08/27/2020   Procedure: LEFT BREAST LUMPECTOMY WITH RADIOACTIVE SEED LOCALIZATION;  Surgeon: Harriette Bouillon, MD;  Location: [facility];  Service: General;  Laterality: Left;  . ENDOMETRIAL ABLATION    . LABIOPLASTY  Bilateral 09/22/2017   Procedure: LABIAL reduction;  Surgeon: Ranae Pila, MD;  Location: [facility];  Service: Gynecology;  Laterality: Bilateral;  . RHINOPLASTY  1995  . sclero theray  09/21/2016   varicose viens  . WISDOM TOOTH EXTRACTION     Social History   Socioeconomic History  . Marital status: Married    Spouse name: Not on file  . Number of children: Not on file  . Years of education: Not on file  . Highest education level: Not on file  Occupational History  . Not on file  Tobacco Use  . Smoking  status: Never Smoker  . Smokeless tobacco: Never Used  Vaping Use  . Vaping Use: Never used  Substance and Sexual Activity  . Alcohol use: Yes    Comment: social  . Drug use: No  . Sexual activity: Yes    Birth control/protection: Surgical    Comment: husband has had vasectomy and pt ablation  Other Topics Concern  . Not on file  Social History Narrative  . Not on file   Social Determinants of Health   Financial Resource Strain: Not on file  Food Insecurity: Not on file  Transportation Needs: Not on file  Physical Activity: Not on file  Stress: Not on file  Social Connections: Not on file  Intimate Partner Violence: Not on file   No family status information on file.   No family history on file. Allergies  Allergen Reactions  . Succinylcholine Other (See Comments)    Difficultly waking up  . Sulfa Antibiotics Rash  . Other     [place] - lips swelling, ulcers inside the mouth  . Peanut-Containing Drug Products Itching    Patient Care Team: Maryella Shivers as PCP - General (Physician Assistant)   Medications: Outpatient Medications Prior to Visit  Medication Sig  . Calcium Carb-Cholecalciferol (CALCIUM-VITAMIN D) 500-200 MG-UNIT tablet Take 2 tablets by mouth daily.   . Carboxymethylcellul-Glycerin (LUBRICATING EYE DROPS OP) Apply 1 drop to eye daily as needed (dry eyes).  Marland Kitchen estradiol (VIVELLE-DOT) 0.1 MG/24HR patch once a week.  Marland Kitchen GLUCOSAMINE-CHONDROITIN PO Take 2 tablets by mouth daily.  . Multiple Vitamin (MULTIVITAMIN WITH MINERALS) TABS tablet Take 2 tablets by mouth daily.  . Omega-3 Fatty Acids (FISH OIL PO) Take 2 capsules by mouth daily.  . progesterone (PROMETRIUM) 100 MG capsule Take 100 mg by mouth every evening.   . valACYclovir (VALTREX) 1000 MG tablet TAKE 2 TABLETS BY MOUTH NOW  THEN REPEATIN 12 HOURS AS DIRECTED  . [DISCONTINUED] busPIRone (BUSPAR) 15 MG tablet TAKE 1 TABLET BY MOUTH TWICE DAILY (Patient taking differently: 7.5 mg 2 (two)  times daily.)  . triamterene-hydrochlorothiazide (MAXZIDE-25) 37.5-25 MG tablet Take 1 tablet by mouth daily.  . [DISCONTINUED] Cholecalciferol (VITAMIN D PO) Take 1 capsule by mouth daily. (Patient not taking: Reported on 10/23/2020)  . [DISCONTINUED] HYDROcodone-acetaminophen (NORCO/VICODIN) 5-325 MG tablet Take 1 tablet by mouth every 6 (six) hours as needed for moderate pain. (Patient not taking: Reported on 10/23/2020)  . [DISCONTINUED] ibuprofen (ADVIL) 800 MG tablet Take 1 tablet (800 mg total) by mouth every 8 (eight) hours as needed. (Patient not taking: Reported on 10/23/2020)   No facility-administered medications prior to visit.    Review of Systems  Constitutional: Negative.   HENT: Negative.   Eyes: Negative.   Respiratory: Negative.   Cardiovascular: Negative.   Gastrointestinal: Negative.   Endocrine: Negative.   Genitourinary: Negative.   Musculoskeletal: Negative.   Skin: Negative.  Allergic/Immunologic: Negative.   Neurological: Negative.   Hematological: Negative.   Psychiatric/Behavioral: Negative.       Objective    BP 118/88 (BP Location: Left Arm, Patient Position: Sitting, Cuff Size: Large)   Pulse 79   Temp 98.1 F (36.7 C) (Oral)   Ht 5\' 10"  (1.778 m)   Wt 140 lb 3.2 oz (63.6 kg)   SpO2 100%   BMI 20.12 kg/m    Physical Exam Constitutional:      Appearance: Normal appearance.  HENT:     Right Ear: Tympanic membrane, ear canal and external ear normal.     Left Ear: Tympanic membrane, ear canal and external ear normal.  Cardiovascular:     Rate and Rhythm: Normal rate and regular rhythm.     Pulses: Normal pulses.     Heart sounds: Normal heart sounds.  Pulmonary:     Effort: Pulmonary effort is normal.     Breath sounds: Normal breath sounds.  Abdominal:     General: Abdomen is flat. Bowel sounds are normal.     Palpations: Abdomen is soft.  Skin:    General: Skin is warm and dry.  Neurological:     General: No focal deficit present.      Mental Status: She is alert and oriented to person, place, and time.  Psychiatric:        Mood and Affect: Mood normal.        Behavior: Behavior normal.       Last depression screening scores PHQ 2/9 Scores 10/23/2020 03/08/2019 08/25/2017  PHQ - 2 Score 0 0 0  PHQ- 9 Score 3 3 -   Last fall risk screening Fall Risk  10/23/2020  Falls in the past year? 0  Number falls in past yr: 0  Injury with Fall? 0  Risk for fall due to : No Fall Risks  Follow up Falls evaluation completed   Last Audit-C alcohol use screening Alcohol Use Disorder Test (AUDIT) 10/23/2020  1. How often do you have a drink containing alcohol? 1  2. How many drinks containing alcohol do you have on a typical day when you are drinking? 0  3. How often do you have six or more drinks on one occasion? 0  AUDIT-C Score 1   A score of 3 or more in women, and 4 or more in men indicates increased risk for alcohol abuse, EXCEPT if all of the points are from question 1   No results found for any visits on 10/23/20.  Assessment & Plan    Routine Health Maintenance and Physical Exam  Exercise Activities and Dietary recommendations Goals   None     Immunization History  Administered Date(s) Administered  . Influenza,inj,Quad PF,6+ Mos 07/09/2014  . Influenza-Unspecified 06/29/2018  . Tdap 06/21/2013  . Zoster Recombinat (Shingrix) 08/25/2017, 11/24/2017    Health Maintenance  Topic Date Due  . COVID-19 Vaccine (1) 11/08/2020 (Originally 12/19/1965)  . INFLUENZA VACCINE  12/10/2020 (Originally 04/12/2020)  . MAMMOGRAM  07/03/2022  . PAP SMEAR-Modifier  05/16/2023  . TETANUS/TDAP  06/22/2023  . COLONOSCOPY (Pts 45-31yrs Insurance coverage will need to be confirmed)  08/01/2024  . Hepatitis C Screening  Completed  . HIV Screening  Completed    Discussed health benefits of physical activity, and encouraged her to engage in regular exercise appropriate for her age and condition.  1. Annual physical  exam  - TSH - Lipid panel - Comprehensive metabolic panel - CBC with Differential/Platelet  2.  Atypical lobular hyperplasia (ALH) of left breast  Followed by breast surgeon.   3. Anxiety  Continue.   - busPIRone (BUSPAR) 7.5 MG tablet; Take 1 tablet (7.5 mg total) by mouth 2 (two) times daily.  Dispense: 90 tablet; Refill: 3   Return in about 1 year (around 10/23/2021) for CPE and anxiety .     ITrey Sailors, PA-C, have reviewed all documentation for this visit. The documentation on 10/23/20 for the exam, diagnosis, procedures, and orders are all accurate and complete.  The entirety of the information documented in the History of Present Illness, Review of Systems and Physical Exam were personally obtained by me. Portions of this information were initially documented by [facility] and reviewed by me for thoroughness and accuracy.     Maryella Shivers  [facility] [phone] (phone) [phone] (fax)  [facility]

## 2020-10-23 NOTE — Patient Instructions (Signed)
Health Maintenance, Female Adopting a healthy lifestyle and getting preventive care are important in promoting health and wellness. Ask your health care provider about:  The right schedule for you to have regular tests and exams.  Things you can do on your own to prevent diseases and keep yourself healthy. What should I know about diet, weight, and exercise? Eat a healthy diet  Eat a diet that includes plenty of vegetables, fruits, low-fat dairy products, and lean protein.  Do not eat a lot of foods that are high in solid fats, added sugars, or sodium.   Maintain a healthy weight Body mass index (BMI) is used to identify weight problems. It estimates body fat based on height and weight. Your health care provider can help determine your BMI and help you achieve or maintain a healthy weight. Get regular exercise Get regular exercise. This is one of the most important things you can do for your health. Most adults should:  Exercise for at least 150 minutes each week. The exercise should increase your heart rate and make you sweat (moderate-intensity exercise).  Do strengthening exercises at least twice a week. This is in addition to the moderate-intensity exercise.  Spend less time sitting. Even light physical activity can be beneficial. Watch cholesterol and blood lipids Have your blood tested for lipids and cholesterol at 58 years of age, then have this test every 5 years. Have your cholesterol levels checked more often if:  Your lipid or cholesterol levels are high.  You are older than 58 years of age.  You are at high risk for heart disease. What should I know about cancer screening? Depending on your health history and family history, you may need to have cancer screening at various ages. This may include screening for:  Breast cancer.  Cervical cancer.  Colorectal cancer.  Skin cancer.  Lung cancer. What should I know about heart disease, diabetes, and high blood  pressure? Blood pressure and heart disease  High blood pressure causes heart disease and increases the risk of stroke. This is more likely to develop in people who have high blood pressure readings, are of African descent, or are overweight.  Have your blood pressure checked: ? Every 3-5 years if you are 18-39 years of age. ? Every year if you are 40 years old or older. Diabetes Have regular diabetes screenings. This checks your fasting blood sugar level. Have the screening done:  Once every three years after age 40 if you are at a normal weight and have a low risk for diabetes.  More often and at a younger age if you are overweight or have a high risk for diabetes. What should I know about preventing infection? Hepatitis B If you have a higher risk for hepatitis B, you should be screened for this virus. Talk with your health care provider to find out if you are at risk for hepatitis B infection. Hepatitis C Testing is recommended for:  Everyone born from 1945 through 1965.  Anyone with known risk factors for hepatitis C. Sexually transmitted infections (STIs)  Get screened for STIs, including gonorrhea and chlamydia, if: ? You are sexually active and are younger than 58 years of age. ? You are older than 58 years of age and your health care provider tells you that you are at risk for this type of infection. ? Your sexual activity has changed since you were last screened, and you are at increased risk for chlamydia or gonorrhea. Ask your health care provider   if you are at risk.  Ask your health care provider about whether you are at high risk for HIV. Your health care provider may recommend a prescription medicine to help prevent HIV infection. If you choose to take medicine to prevent HIV, you should first get tested for HIV. You should then be tested every 3 months for as long as you are taking the medicine. Pregnancy  If you are about to stop having your period (premenopausal) and  you may become pregnant, seek counseling before you get pregnant.  Take 400 to 800 micrograms (mcg) of folic acid every day if you become pregnant.  Ask for birth control (contraception) if you want to prevent pregnancy. Osteoporosis and menopause Osteoporosis is a disease in which the bones lose minerals and strength with aging. This can result in bone fractures. If you are 65 years old or older, or if you are at risk for osteoporosis and fractures, ask your health care provider if you should:  Be screened for bone loss.  Take a calcium or vitamin D supplement to lower your risk of fractures.  Be given hormone replacement therapy (HRT) to treat symptoms of menopause. Follow these instructions at home: Lifestyle  Do not use any products that contain nicotine or tobacco, such as cigarettes, e-cigarettes, and chewing tobacco. If you need help quitting, ask your health care provider.  Do not use street drugs.  Do not share needles.  Ask your health care provider for help if you need support or information about quitting drugs. Alcohol use  Do not drink alcohol if: ? Your health care provider tells you not to drink. ? You are pregnant, may be pregnant, or are planning to become pregnant.  If you drink alcohol: ? Limit how much you use to 0-1 drink a day. ? Limit intake if you are breastfeeding.  Be aware of how much alcohol is in your drink. In the [place] one drink equals one 12 oz bottle of beer (355 mL), one 5 oz glass of wine (148 mL), or one 1 oz glass of hard liquor (44 mL). General instructions  Schedule regular health, dental, and eye exams.  Stay current with your vaccines.  Tell your health care provider if: ? You often feel depressed. ? You have ever been abused or do not feel safe at home. Summary  Adopting a healthy lifestyle and getting preventive care are important in promoting health and wellness.  Follow your health care provider's instructions about healthy  diet, exercising, and getting tested or screened for diseases.  Follow your health care provider's instructions on monitoring your cholesterol and blood pressure. This information is not intended to replace advice given to you by your health care provider. Make sure you discuss any questions you have with your health care provider. Document Revised: 08/22/2018 Document Reviewed: 08/22/2018 Elsevier Patient Education  2021 Elsevier Inc.  

## 2020-10-24 LAB — CBC WITH DIFFERENTIAL/PLATELET
Basophils Absolute: 0.1 10*3/uL (ref 0.0–0.2)
Basos: 1 %
EOS (ABSOLUTE): 0.2 10*3/uL (ref 0.0–0.4)
Eos: 5 %
Hematocrit: 43.6 % (ref 34.0–46.6)
Hemoglobin: 14.6 g/dL (ref 11.1–15.9)
Immature Grans (Abs): 0 10*3/uL (ref 0.0–0.1)
Immature Granulocytes: 0 %
Lymphocytes Absolute: 1.8 10*3/uL (ref 0.7–3.1)
Lymphs: 37 %
MCH: 31.5 pg (ref 26.6–33.0)
MCHC: 33.5 g/dL (ref 31.5–35.7)
MCV: 94 fL (ref 79–97)
Monocytes Absolute: 0.4 10*3/uL (ref 0.1–0.9)
Monocytes: 8 %
Neutrophils Absolute: 2.4 10*3/uL (ref 1.4–7.0)
Neutrophils: 49 %
Platelets: 260 10*3/uL (ref 150–450)
RBC: 4.63 x10E6/uL (ref 3.77–5.28)
RDW: 11.9 % (ref 11.7–15.4)
WBC: 4.9 10*3/uL (ref 3.4–10.8)

## 2020-10-24 LAB — LIPID PANEL
Chol/HDL Ratio: 2.5 ratio (ref 0.0–4.4)
Cholesterol, Total: 184 mg/dL (ref 100–199)
HDL: 73 mg/dL (ref >39)
LDL Chol Calc (NIH): 97 mg/dL (ref 0–99)
Triglycerides: 78 mg/dL (ref 0–149)
VLDL Cholesterol Cal: 14 mg/dL (ref 5–40)

## 2020-10-24 LAB — COMPREHENSIVE METABOLIC PANEL
ALT: 16 IU/L (ref 0–32)
AST: 22 IU/L (ref 0–40)
Albumin/Globulin Ratio: 2.3 — ABNORMAL HIGH (ref 1.2–2.2)
Albumin: 4.9 g/dL (ref 3.8–4.9)
Alkaline Phosphatase: 63 IU/L (ref 44–121)
BUN/Creatinine Ratio: 19 (ref 9–23)
BUN: 16 mg/dL (ref 6–24)
Bilirubin Total: 0.9 mg/dL (ref 0.0–1.2)
CO2: 27 mmol/L (ref 20–29)
Calcium: 9.6 mg/dL (ref 8.7–10.2)
Chloride: 99 mmol/L (ref 96–106)
Creatinine, Ser: 0.85 mg/dL (ref 0.57–1.00)
GFR calc Af Amer: 88 mL/min/{1.73_m2} (ref >59)
GFR calc non Af Amer: 76 mL/min/{1.73_m2} (ref >59)
Globulin, Total: 2.1 g/dL (ref 1.5–4.5)
Glucose: 85 mg/dL (ref 65–99)
Potassium: 3.7 mmol/L (ref 3.5–5.2)
Sodium: 138 mmol/L (ref 134–144)
Total Protein: 7 g/dL (ref 6.0–8.5)

## 2020-10-24 LAB — TSH: TSH: 1.89 u[IU]/mL (ref 0.450–4.500)

## 2020-10-26 ENCOUNTER — Encounter: Admitting: Family Medicine

## 2020-12-07 ENCOUNTER — Other Ambulatory Visit: Payer: Self-pay | Admitting: Physician Assistant

## 2020-12-07 DIAGNOSIS — I10 Essential (primary) hypertension: Secondary | ICD-10-CM

## 2020-12-07 DIAGNOSIS — J301 Allergic rhinitis due to pollen: Secondary | ICD-10-CM

## 2020-12-07 NOTE — Telephone Encounter (Signed)
Requested medication (s) are due for refill today:  no  Requested medication (s) are on the active medication list: yes  Last refill:  10/02/2020  Future visit scheduled:  yes  Notes to clinic:  Patient is last seen by Nettie Elm Review for refill    Requested Prescriptions  Pending Prescriptions Disp Refills   triamterene-hydrochlorothiazide (MAXZIDE-25) 37.5-25 MG tablet [Pharmacy Med Name: TRIAMTERENE-HCTZ 37.5-25 MG TAB] 90 tablet 1    Sig: TAKE 1 TABLET BY MOUTH DAILY      Cardiovascular: Diuretic Combos Passed - 12/07/2020 11:33 AM      Passed - K in normal range and within 360 days    Potassium  Date Value Ref Range Status  10/23/2020 3.7 3.5 - 5.2 mmol/L Final          Passed - Na in normal range and within 360 days    Sodium  Date Value Ref Range Status  10/23/2020 138 134 - 144 mmol/L Final          Passed - Cr in normal range and within 360 days    Creat  Date Value Ref Range Status  06/23/2017 0.73 0.50 - 1.05 mg/dL Final    Comment:    For patients >72 years of age, the reference limit for Creatinine is approximately 13% higher for people identified as African-American. .    Creatinine, Ser  Date Value Ref Range Status  10/23/2020 0.85 0.57 - 1.00 mg/dL Final          Passed - Ca in normal range and within 360 days    Calcium  Date Value Ref Range Status  10/23/2020 9.6 8.7 - 10.2 mg/dL Final          Passed - Last BP in normal range    BP Readings from Last 1 Encounters:  10/23/20 118/88          Passed - Valid encounter within last 6 months    Recent Outpatient Visits           1 month ago Annual physical exam   [facility] Trey Sailors, [place]   1 year ago Anxiety   [facility] [place], Lavella Hammock, [place]   1 year ago Anxiety   [facility] [place], Lavella Hammock, [place]   1 year ago Anxiety   [facility] [place], Lavella Hammock, [place]   2 years ago Anxiety   [facility] [place], Molly Maduro, [place]       Future Appointments             In 10 months [place], Lavella Hammock, PA-C [facility]               cetirizine (ZYRTEC) 10 MG tablet [employer] Med Name: CETIRIZINE HCL 10 MG TAB] 90 tablet 1    Sig: TAKE ONE TABLET EVERY DAY      Ear, Nose, and Throat:  Antihistamines Passed - 12/07/2020 11:33 AM      Passed - Valid encounter within last 12 months    Recent Outpatient Visits           1 month ago Annual physical exam   [facility] Trey Sailors, [place]   1 year ago Anxiety   [facility] [place], Lavella Hammock, [place]   1 year ago Anxiety   [facility] [place] [facility] [place], Lavella Hammock, [place]   1 year ago Anxiety   [facility] [place], Ricki Rodriguez [place], [place]  2 years ago Anxiety   [facility] [place], Molly Maduro, [place]       Future Appointments             In 10 months Jodi Marble, Lavella Hammock, PA-C [facility]

## 2021-01-05 IMAGING — MG MM PLC BREAST LOC DEV 1ST LESION INC MAMMO GUIDE*L*
8 of 10 series · 8 of 10 positions shown · non-contrast
Comparison: Previous exam(s).

CLINICAL DATA: Localization prior to surgery

EXAM:
MAMMOGRAPHIC GUIDED RADIOACTIVE SEED LOCALIZATION OF THE LEFT BREAST

[L CC (1 of 5)]
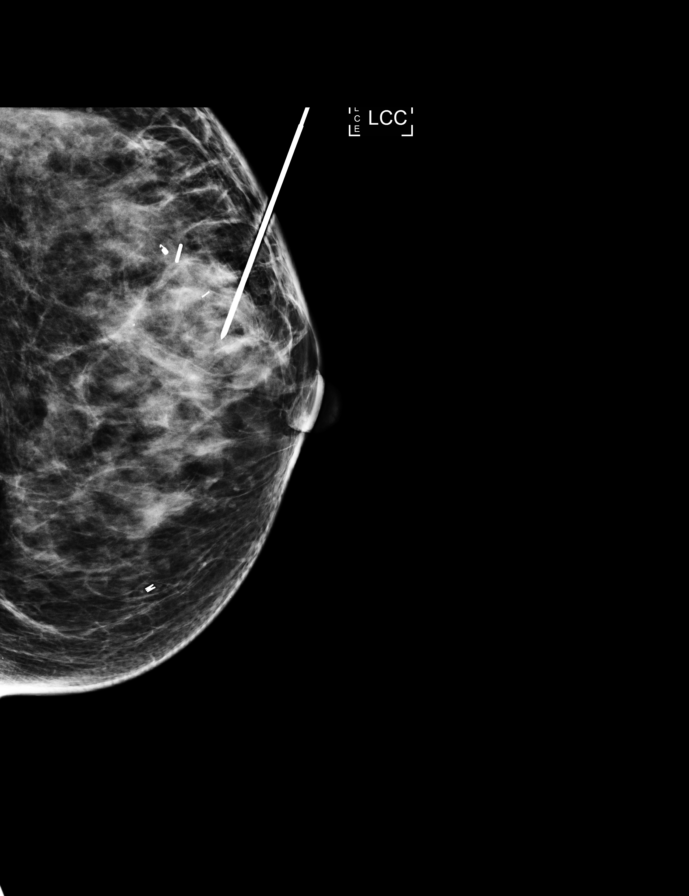

[L CC (2 of 5)]
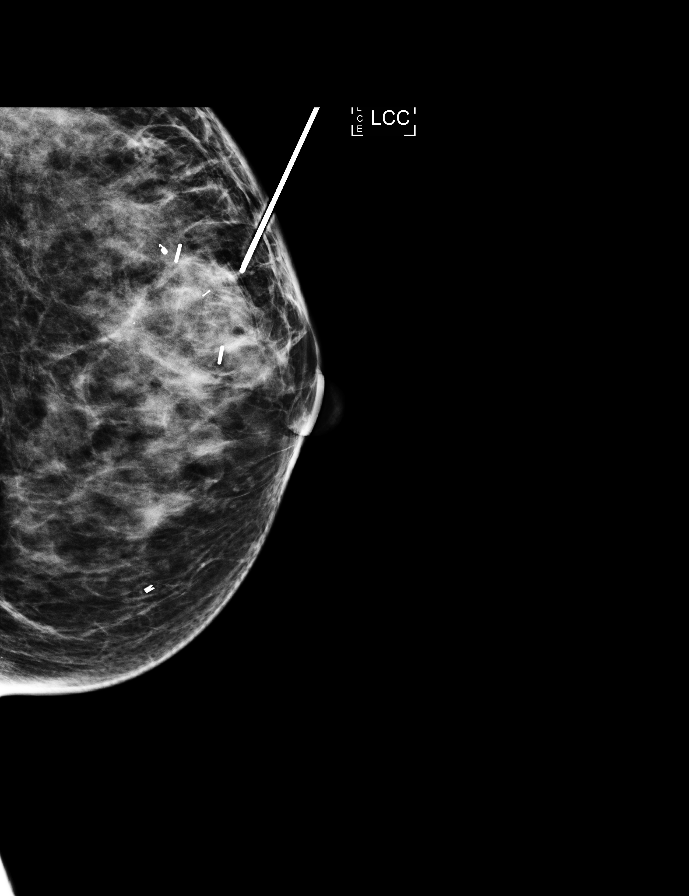

[L CC (3 of 5)]
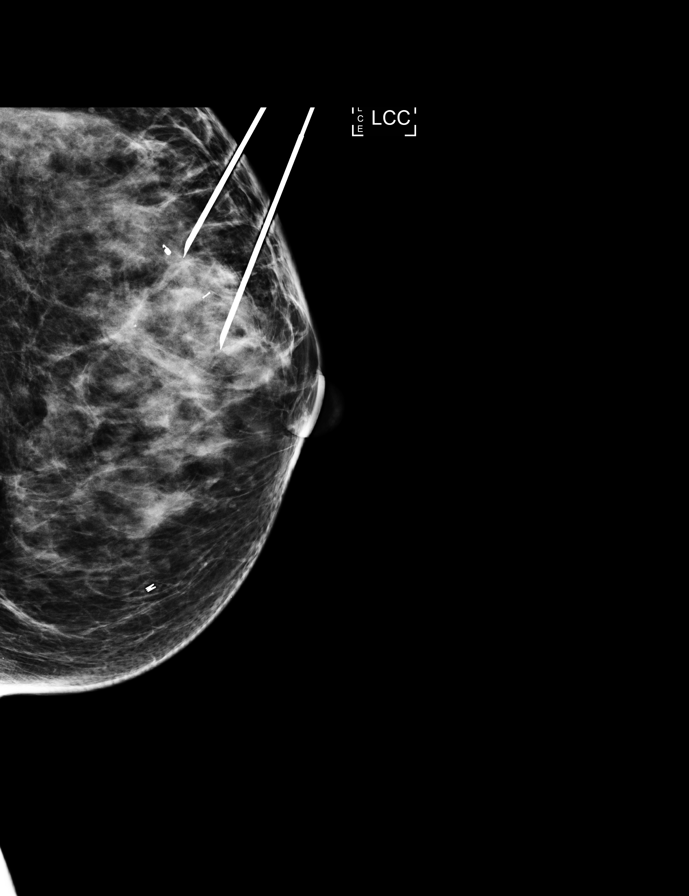

[L CC (4 of 5)]
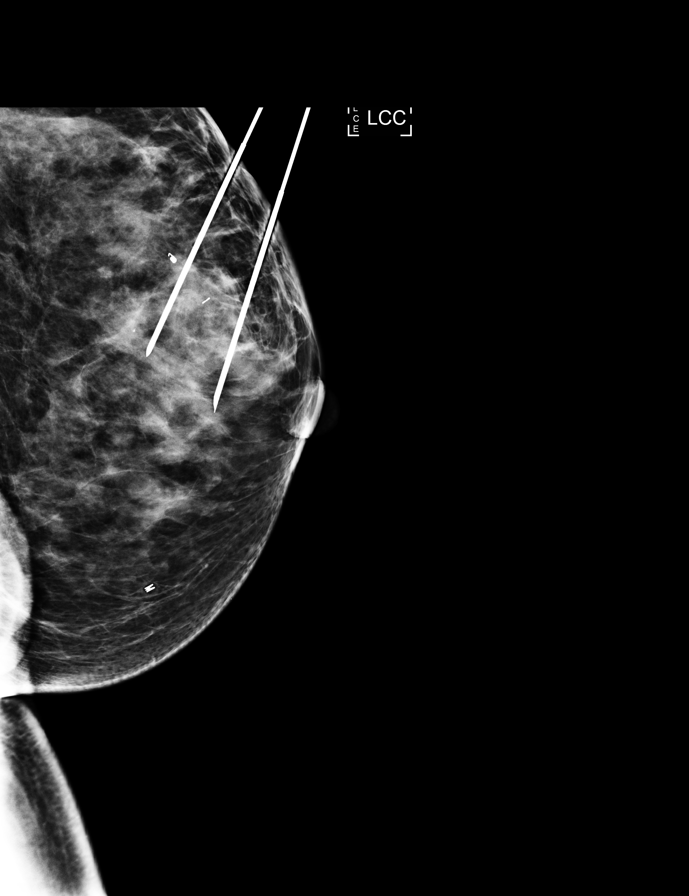

[L LM (1 of 3)]
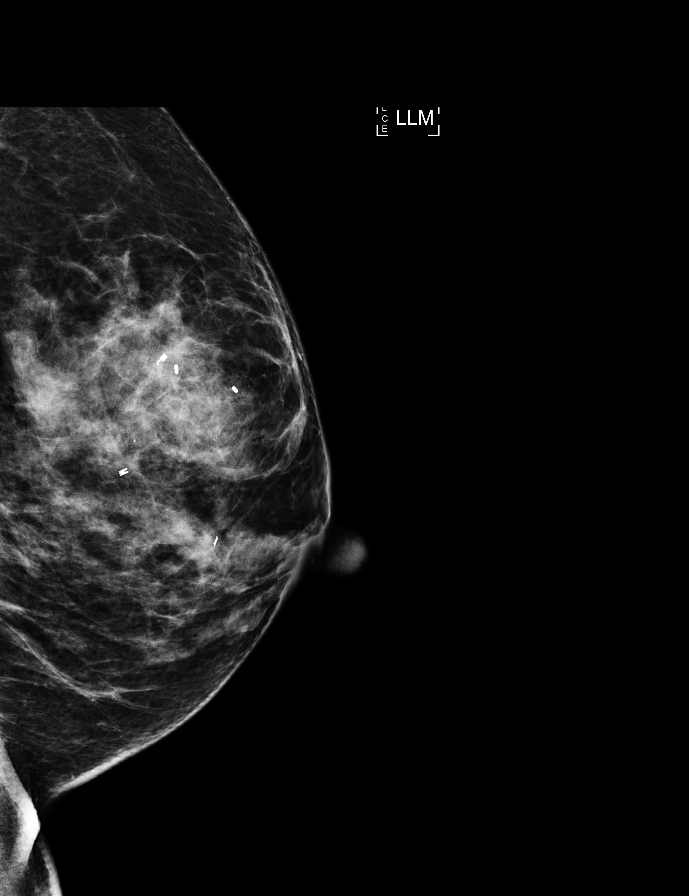

[L CC (5 of 5)]
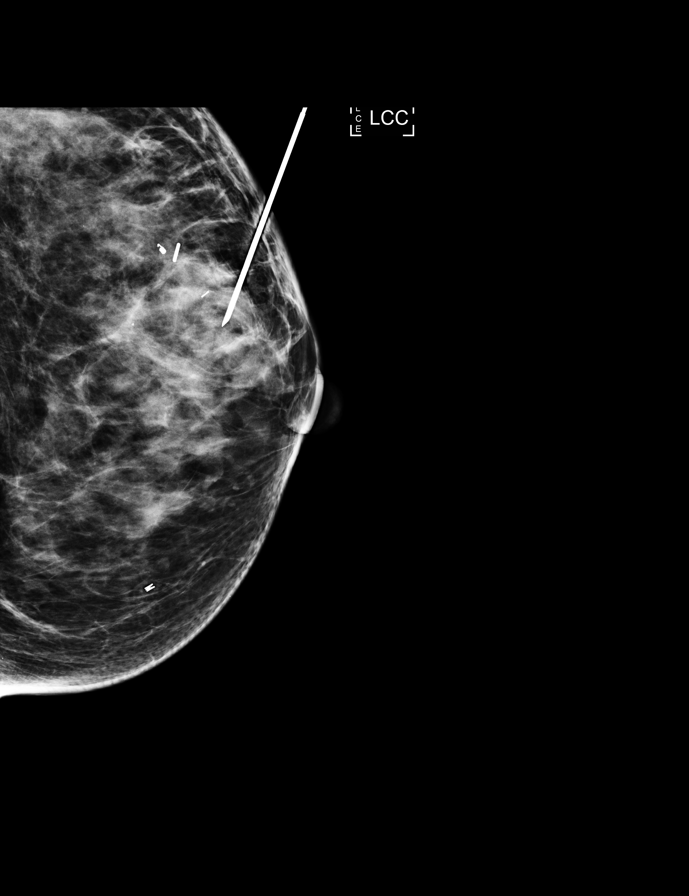

[L LM (2 of 3)]
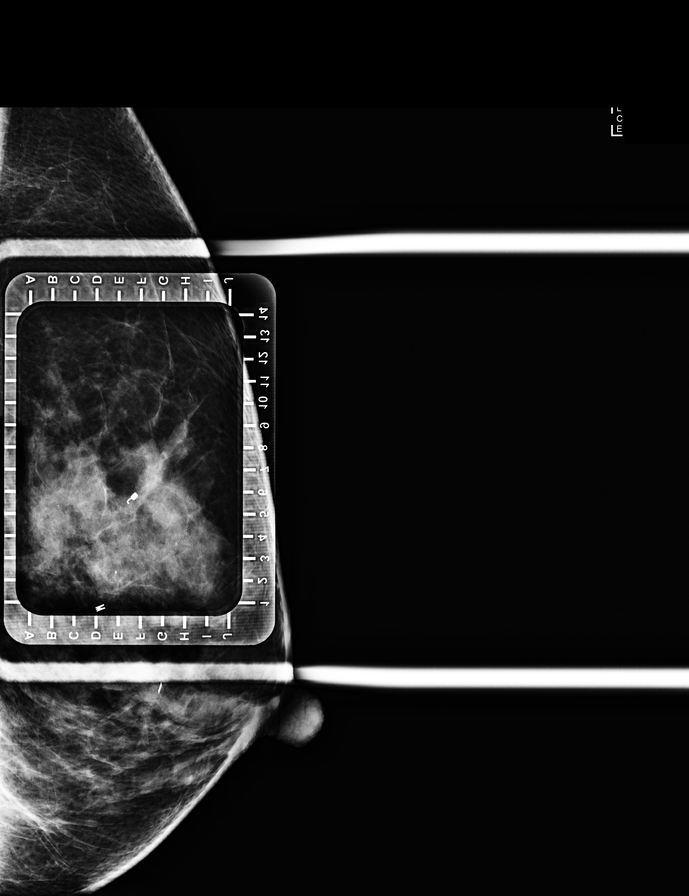

[L LM (3 of 3)]
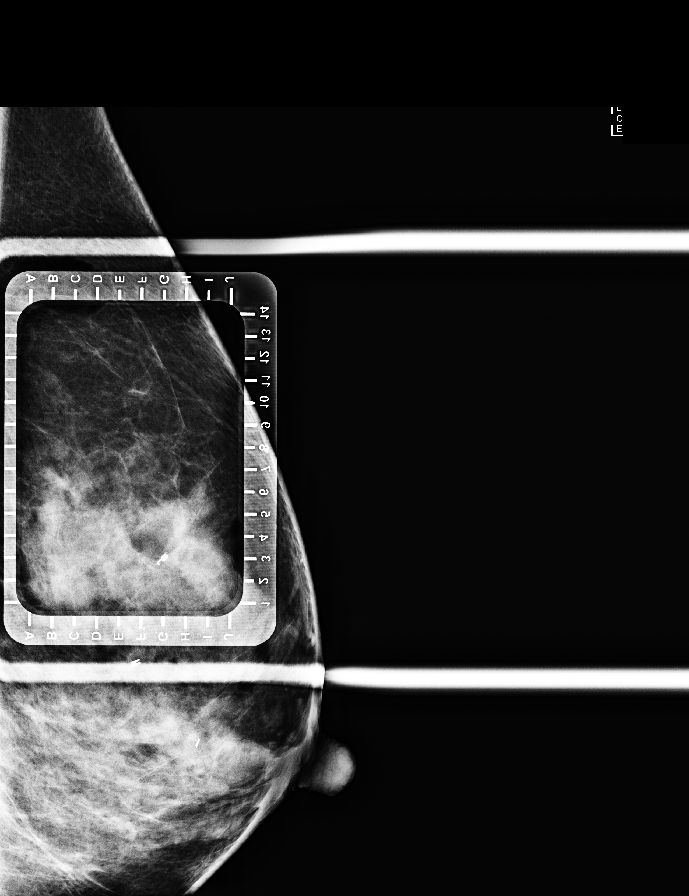

[8 of 10 positions shown; findings below may reference images not displayed]



The usual time-out protocol was performed immediately prior to the
procedure.

Using mammographic guidance, sterile technique, 1% lidocaine and an
L-F05 radioactive seed, the site of the recent anterior biopsy was
localized using a lateral approach. The follow-up mammogram images
confirm the seed in the expected location and were marked for the
surgeon.

Follow-up survey of the patient confirms presence of the radioactive
seed.

Order number of L-F05 seed:  [id number].

Total activity:  0.258 millicuries reference Date: August 24, 2020

Using mammographic guidance, sterile technique, 1% lidocaine and an
L-F05 radioactive seed, the coil shaped clip was localized using a
lateral approach. The follow-up mammogram images confirm the seed in
the expected location and were marked for the surgeon.

Follow-up survey of the patient confirms presence of the radioactive
seed.

Order number of L-F05 seed:  [id number].

Total activity:  0.258 millicuries reference Date: August 24, 2020

The patient tolerated the procedure well and was released from the
[REDACTED]. She was given instructions regarding seed removal.
IMPRESSION: Radioactive seed localizations left breast. No apparent
complications.

## 2021-02-24 ENCOUNTER — Telehealth: Payer: Self-pay

## 2021-02-24 DIAGNOSIS — B001 Herpesviral vesicular dermatitis: Secondary | ICD-10-CM

## 2021-02-24 MED ORDER — VALACYCLOVIR HCL 1 G PO TABS: ORAL_TABLET | ORAL | 1 refills | Status: DC

## 2021-02-24 NOTE — Telephone Encounter (Signed)
[facility] faxed refill request for the following medications:  valACYclovir (VALTREX) 1000 MG tablet   Please advise. 

## 2021-04-05 ENCOUNTER — Other Ambulatory Visit: Payer: Self-pay | Admitting: Obstetrics and Gynecology

## 2021-04-05 DIAGNOSIS — Z9889 Other specified postprocedural states: Secondary | ICD-10-CM

## 2021-04-06 ENCOUNTER — Other Ambulatory Visit: Payer: Self-pay | Admitting: Obstetrics and Gynecology

## 2021-04-06 DIAGNOSIS — Z1231 Encounter for screening mammogram for malignant neoplasm of breast: Secondary | ICD-10-CM

## 2021-04-21 ENCOUNTER — Other Ambulatory Visit: Payer: Self-pay

## 2021-04-21 ENCOUNTER — Telehealth: Payer: Self-pay | Admitting: Physician Assistant

## 2021-04-21 DIAGNOSIS — F419 Anxiety disorder, unspecified: Secondary | ICD-10-CM

## 2021-04-21 MED ORDER — BUSPIRONE HCL 7.5 MG PO TABS: 7.5000 mg | ORAL_TABLET | Freq: Two times a day (BID) | ORAL | 1 refills | Status: DC

## 2021-04-21 NOTE — Telephone Encounter (Signed)
[facility] faxed refill request for the following medications:   busPIRone (BUSPAR) 7.5 MG tablet   Please advise.

## 2021-04-30 ENCOUNTER — Other Ambulatory Visit: Payer: Self-pay | Admitting: Family Medicine

## 2021-04-30 DIAGNOSIS — F419 Anxiety disorder, unspecified: Secondary | ICD-10-CM

## 2021-04-30 MED ORDER — BUSPIRONE HCL 7.5 MG PO TABS: 7.5000 mg | ORAL_TABLET | Freq: Two times a day (BID) | ORAL | 1 refills | Status: DC

## 2021-04-30 NOTE — Telephone Encounter (Signed)
I know there are appts available in this clinic before 10/18, but ok to refill to next available appt with any provider

## 2021-04-30 NOTE — Telephone Encounter (Signed)
  Notes to clinic:  Patient would like enough medication to last until appt  Only given 30 tabs and she is taking 2 times a day    Requested Prescriptions  Pending Prescriptions Disp Refills   busPIRone (BUSPAR) 7.5 MG tablet 30 tablet 1    Sig: Take 1 tablet (7.5 mg total) by mouth 2 (two) times daily.     Psychiatry: Anxiolytics/Hypnotics - Non-controlled Failed - 04/30/2021  9:44 AM      Failed - Valid encounter within last 6 months    Recent Outpatient Visits           6 months ago Annual physical exam   [facility] Trey Sailors, [place]   1 year ago Anxiety   [facility] Osvaldo Angst M, [place]   2 years ago Anxiety   [facility] [place], Lavella Hammock, [place]   2 years ago Anxiety   [facility] [place], Lavella Hammock, [place]   2 years ago Anxiety   [facility] [place], Molly Maduro, [place]       Future Appointments             In 2 months Bacigalupo, Marzella Schlein, MD [facility]   In 5 months Jodi Marble, Lavella Hammock, PA-C [facility]

## 2021-04-30 NOTE — Telephone Encounter (Signed)
Medication Refill - Medication:  busPIRone (BUSPAR) 7.5 MG tablet  Has the patient contacted their pharmacy? Yes.   Need appt to refill  *Pt has appt set for 10/18, wanting to see about getting a refill to hold until appt, please advise*  Preferred Pharmacy (with phone number or street name):  [facility] - [place], [place] - [address]  [address] [place], [place] [address]  Phone:  [phone]  Fax:  [phone]  Agent: Please be advised that RX refills may take up to 3 business days. We ask that you follow-up with your pharmacy.

## 2021-05-27 ENCOUNTER — Telehealth: Payer: Self-pay

## 2021-05-27 NOTE — Telephone Encounter (Signed)
Copied from CRM [id number]. Topic: Appointment Scheduling - Scheduling Inquiry for Clinic >> May 27, 2021 10:25 AM Jill Robbins wrote: Reason for CRM: pt is bringing dad (pt) in for his apt on Thursday 9/29 and would like to know if she could have her flu shot while in the office?   Please advise

## 2021-05-27 NOTE — Telephone Encounter (Signed)
Scheduled for flu vaccine only 06/10/21 10:40pm

## 2021-06-10 ENCOUNTER — Ambulatory Visit: Admitting: Family Medicine

## 2021-06-14 ENCOUNTER — Ambulatory Visit: Payer: Self-pay | Admitting: *Deleted

## 2021-06-15 NOTE — Telephone Encounter (Signed)
Please advise 

## 2021-06-15 NOTE — Telephone Encounter (Signed)
Message from Valora Piccolo sent at 06/14/2021  4:57 PM EDT  Pt is calling to ask advice would the antiviral be appropriate for her. Her husband has tested positve for COVID on 07/10/21. Pt tested negative 07/10/21. Pt started getting body aches on Saturday 06/22/21. Please advise   Nurse Triage attemted to call the patient x 3 with no return call.

## 2021-06-15 NOTE — Telephone Encounter (Signed)
Spoke with patient and advised her of your message below, she states that she actually tested positive for covid on 06/14/21, symptoms began 06/11/21. Patient states that she no longer has fever and stated that she didn't know if she would benefit from starting antiviral drug or if there is a benefit to taking it versus not taking it? Patient also request clarification on how long she should quarantine? KW

## 2021-06-16 ENCOUNTER — Other Ambulatory Visit: Payer: Self-pay | Admitting: Family Medicine

## 2021-06-16 DIAGNOSIS — U071 COVID-19: Secondary | ICD-10-CM

## 2021-06-16 MED ORDER — NIRMATRELVIR/RITONAVIR (PAXLOVID)TABLET: 3.0000 | ORAL_TABLET | Freq: Two times a day (BID) | ORAL | 0 refills | Status: AC

## 2021-06-16 NOTE — Telephone Encounter (Signed)
Patient advised and states that she would like to start antiviral medication. Please send to [facility]. KW

## 2021-06-18 ENCOUNTER — Ambulatory Visit

## 2021-06-21 ENCOUNTER — Ambulatory Visit
Admission: RE | Admit: 2021-06-21 | Discharge: 2021-06-21 | Disposition: A | Discharge status: Home / Self Care | Source: Ambulatory Visit | Attending: Obstetrics and Gynecology | Admitting: Obstetrics and Gynecology

## 2021-06-21 ENCOUNTER — Encounter

## 2021-06-21 ENCOUNTER — Other Ambulatory Visit: Payer: Self-pay

## 2021-06-21 DIAGNOSIS — Z1231 Encounter for screening mammogram for malignant neoplasm of breast: Secondary | ICD-10-CM

## 2021-06-29 ENCOUNTER — Encounter: Payer: Self-pay | Admitting: Family Medicine

## 2021-06-29 ENCOUNTER — Ambulatory Visit (INDEPENDENT_AMBULATORY_CARE_PROVIDER_SITE_OTHER): Admitting: Family Medicine

## 2021-06-29 ENCOUNTER — Other Ambulatory Visit: Payer: Self-pay

## 2021-06-29 VITALS — BP 100/76 | HR 87 | Temp 97.6°F | Resp 16 | Ht 70.0 in | Wt 141.5 lb

## 2021-06-29 DIAGNOSIS — J301 Allergic rhinitis due to pollen: Secondary | ICD-10-CM

## 2021-06-29 DIAGNOSIS — F419 Anxiety disorder, unspecified: Secondary | ICD-10-CM | POA: Diagnosis not present

## 2021-06-29 DIAGNOSIS — I10 Essential (primary) hypertension: Secondary | ICD-10-CM

## 2021-06-29 MED ORDER — CETIRIZINE HCL 10 MG PO TABS: 10.0000 mg | ORAL_TABLET | Freq: Every day | ORAL | 3 refills | Status: DC

## 2021-06-29 MED ORDER — TRIAMTERENE-HCTZ 37.5-25 MG PO TABS: 1.0000 | ORAL_TABLET | Freq: Every day | ORAL | 1 refills | Status: DC

## 2021-06-29 MED ORDER — BUSPIRONE HCL 7.5 MG PO TABS: 7.5000 mg | ORAL_TABLET | Freq: Two times a day (BID) | ORAL | 1 refills | Status: DC

## 2021-06-29 NOTE — Progress Notes (Signed)
Established patient visit   Patient: Jill Robbins   DOB: 27-Jul-1961   60 y.o. Female  MRN: [id number] Visit Date: 06/29/2021  Today's healthcare provider: Shirlee Latch, MD   Chief Complaint  Patient presents with   Anxiety   Hypertension   Subjective    HPI  Anxiety, Follow-up  She was last seen for anxiety 8 months ago. Changes made at last visit include no changes.   She reports excellent compliance with treatment. She reports excellent tolerance of treatment. She is not having side effects.   She feels her anxiety is mild and Improved since last visit.  Symptoms: No chest pain No difficulty concentrating  No dizziness No fatigue  No feelings of losing control No insomnia  No irritable No palpitations  No panic attacks No racing thoughts  No shortness of breath No sweating  No tremors/shakes    GAD-7 Results GAD-7 Generalized Anxiety Disorder Screening Tool 06/29/2021 03/08/2019 07/13/2018  1. Feeling Nervous, Anxious, or on Edge 0 3 3  2. Not Being Able to Stop or Control Worrying 0 3 3  3. Worrying Too Much About Different Things 0 3 3  4. Trouble Relaxing 0 1 3  5. Being So Restless it's Hard To Sit Still 1 0 3  6. Becoming Easily Annoyed or Irritable 0 1 3  7. Feeling Afraid As If Something Awful Might Happen 0 2 3  Total GAD-7 Score 1 13 21   Difficulty At Work, Home, or Getting  Along With Others? Not difficult at all Somewhat difficult Very difficult    PHQ-9 Scores PHQ9 SCORE ONLY 06/29/2021 10/23/2020 03/08/2019  PHQ-9 Total Score 2 3 3     --------------------------------------------------------------------------------------------------- Hypertension, follow-up  BP Readings from Last 3 Encounters:  06/29/21 100/76  10/23/20 118/88  08/27/20 121/79   Wt Readings from Last 3 Encounters:  06/29/21 141 lb 8 oz (64.2 kg)  10/23/20 140 lb 3.2 oz (63.6 kg)  08/27/20 139 lb 5.3 oz (63.2 kg)     She was last seen for hypertension 8 months ago.   BP at that visit was 118/88. Management since that visit includes no changes.  She reports excellent compliance with treatment. She is not having side effects.  She is following a Regular diet. She is exercising. She does not smoke.  Use of agents associated with hypertension: none.   Outside blood pressures are stable. Symptoms: No chest pain No chest pressure  No palpitations No syncope  No dyspnea No orthopnea  No paroxysmal nocturnal dyspnea No lower extremity edema   Pertinent labs: Lab Results  Component Value Date   CHOL 184 10/23/2020   HDL 73 10/23/2020   LDLCALC 97 10/23/2020   TRIG 78 10/23/2020   CHOLHDL 2.5 10/23/2020   Lab Results  Component Value Date   NA 138 10/23/2020   K 3.7 10/23/2020   CREATININE 0.85 10/23/2020   GFRNONAA 76 10/23/2020   GLUCOSE 85 10/23/2020     The 10-year ASCVD risk score (Arnett DK, et al., 2019) is: 1.6%   ---------------------------------------------------------------------------------------------------     Medications: Outpatient Medications Prior to Visit  Medication Sig   Calcium Carb-Cholecalciferol (CALCIUM-VITAMIN D) 500-200 MG-UNIT tablet Take 2 tablets by mouth daily.    estradiol (VIVELLE-DOT) 0.1 MG/24HR patch once a week.   GLUCOSAMINE-CHONDROITIN PO Take 2 tablets by mouth daily.   Multiple Vitamin (MULTIVITAMIN WITH MINERALS) TABS tablet Take 2 tablets by mouth daily.   Omega-3 Fatty Acids (FISH OIL PO) Take 2  capsules by mouth daily.   progesterone (PROMETRIUM) 100 MG capsule Take 100 mg by mouth every evening.    [DISCONTINUED] busPIRone (BUSPAR) 7.5 MG tablet Take 1 tablet (7.5 mg total) by mouth 2 (two) times daily.   [DISCONTINUED] cetirizine (ZYRTEC) 10 MG tablet TAKE ONE TABLET EVERY DAY   valACYclovir (VALTREX) 1000 MG tablet TAKE 2 TABLETS BY MOUTH NOW  THEN REPEATIN 12 HOURS AS DIRECTED (Patient not taking: Reported on 06/29/2021)   [DISCONTINUED] Carboxymethylcellul-Glycerin (LUBRICATING EYE  DROPS OP) Apply 1 drop to eye daily as needed (dry eyes).   [DISCONTINUED] triamterene-hydrochlorothiazide (MAXZIDE-25) 37.5-25 MG tablet TAKE 1 TABLET BY MOUTH DAILY   No facility-administered medications prior to visit.    Review of Systems  Constitutional:  Negative for activity change, appetite change and fatigue.  Respiratory:  Negative for chest tightness and shortness of breath.   Cardiovascular:  Negative for chest pain, palpitations and leg swelling.  Psychiatric/Behavioral:  The patient is not nervous/anxious.       Objective    BP 100/76 (BP Location: Left Arm, Patient Position: Sitting, Cuff Size: Large)   Pulse 87   Temp 97.6 F (36.4 C) (Oral)   Resp 16   Ht 5\' 10"  (1.778 m)   Wt 141 lb 8 oz (64.2 kg)   SpO2 99%   BMI 20.30 kg/m  {Show previous vital signs (optional):23777}  Physical Exam Vitals reviewed.  Constitutional:      General: She is not in acute distress.    Appearance: Normal appearance. She is well-developed. She is not diaphoretic.  HENT:     Head: Normocephalic and atraumatic.  Eyes:     General: No scleral icterus.    Conjunctiva/sclera: Conjunctivae normal.  Neck:     Thyroid: No thyromegaly.  Cardiovascular:     Rate and Rhythm: Normal rate and regular rhythm.     Pulses: Normal pulses.     Heart sounds: Normal heart sounds. No murmur heard. Pulmonary:     Effort: Pulmonary effort is normal. No respiratory distress.     Breath sounds: Normal breath sounds. No wheezing, rhonchi or rales.  Musculoskeletal:     Cervical back: Neck supple.     Right lower leg: No edema.     Left lower leg: No edema.  Lymphadenopathy:     Cervical: No cervical adenopathy.  Skin:    General: Skin is warm and dry.     Findings: No rash.  Neurological:     Mental Status: She is alert and oriented to person, place, and time. Mental status is at baseline.  Psychiatric:        Mood and Affect: Mood normal.        Behavior: Behavior normal.      No  results found for any visits on 06/29/21.  Assessment & Plan     Problem List Items Addressed This Visit       Cardiovascular and Mediastinum   Essential hypertension    Well controlled Continue current medications Recheck metabolic panel F/u in 6 months       Relevant Medications   triamterene-hydrochlorothiazide (MAXZIDE-25) 37.5-25 MG tablet   Other Relevant Orders   Basic Metabolic Panel (BMET)     Respiratory   Allergic rhinitis due to pollen    Chronic and well controlled Refilled Zyrtec      Relevant Medications   cetirizine (ZYRTEC) 10 MG tablet     Other   Anxiety - Primary    Chronic and well controlled  Continue buspar at current dose      Relevant Medications   busPIRone (BUSPAR) 7.5 MG tablet     Return in about 4 months (around 10/30/2021) for CPE, as scheduled.      I, Shirlee Latch, MD, have reviewed all documentation for this visit. The documentation on 06/29/21 for the exam, diagnosis, procedures, and orders are all accurate and complete.   Kymberlie Brazeau, Marzella Schlein, MD, MPH [facility]

## 2021-06-29 NOTE — Assessment & Plan Note (Signed)
Chronic and well controlled Refilled Zyrtec

## 2021-06-29 NOTE — Assessment & Plan Note (Signed)
Well controlled Continue current medications Recheck metabolic panel F/u in 6 months  

## 2021-06-29 NOTE — Assessment & Plan Note (Signed)
Chronic and well controlled Continue buspar at current dose 

## 2021-06-30 LAB — BASIC METABOLIC PANEL
BUN/Creatinine Ratio: 23 (ref 9–23)
BUN: 18 mg/dL (ref 6–24)
CO2: 27 mmol/L (ref 20–29)
Calcium: 9.7 mg/dL (ref 8.7–10.2)
Chloride: 101 mmol/L (ref 96–106)
Creatinine, Ser: 0.78 mg/dL (ref 0.57–1.00)
Glucose: 74 mg/dL (ref 70–99)
Potassium: 3.7 mmol/L (ref 3.5–5.2)
Sodium: 141 mmol/L (ref 134–144)
eGFR: 88 mL/min/{1.73_m2} (ref >59)

## 2021-08-02 ENCOUNTER — Other Ambulatory Visit: Payer: Self-pay

## 2021-08-02 ENCOUNTER — Ambulatory Visit
Admission: RE | Admit: 2021-08-02 | Discharge: 2021-08-02 | Disposition: A | Discharge status: Home / Self Care | Source: Ambulatory Visit | Attending: Family Medicine | Admitting: Family Medicine

## 2021-08-02 ENCOUNTER — Ambulatory Visit
Admission: RE | Admit: 2021-08-02 | Discharge: 2021-08-02 | Disposition: A | Discharge status: Home / Self Care | Attending: Family Medicine | Admitting: Family Medicine

## 2021-08-02 ENCOUNTER — Encounter: Payer: Self-pay | Admitting: Family Medicine

## 2021-08-02 ENCOUNTER — Ambulatory Visit (INDEPENDENT_AMBULATORY_CARE_PROVIDER_SITE_OTHER): Admitting: Family Medicine

## 2021-08-02 VITALS — BP 124/80 | HR 97 | Temp 98.1°F | Wt 143.0 lb

## 2021-08-02 DIAGNOSIS — N3090 Cystitis, unspecified without hematuria: Secondary | ICD-10-CM | POA: Insufficient documentation

## 2021-08-02 DIAGNOSIS — R3129 Other microscopic hematuria: Secondary | ICD-10-CM | POA: Insufficient documentation

## 2021-08-02 DIAGNOSIS — R109 Unspecified abdominal pain: Secondary | ICD-10-CM

## 2021-08-02 LAB — POCT URINALYSIS DIPSTICK
Bilirubin, UA: NEGATIVE
Glucose, UA: NEGATIVE
Ketones, UA: NEGATIVE
Nitrite, UA: NEGATIVE
Protein, UA: POSITIVE — AB
Spec Grav, UA: 1.02 (ref 1.010–1.025)
Urobilinogen, UA: NEGATIVE E.U./dL — AB
pH, UA: 6 (ref 5.0–8.0)

## 2021-08-02 MED ORDER — CIPROFLOXACIN HCL 500 MG PO TABS: 500.0000 mg | ORAL_TABLET | Freq: Two times a day (BID) | ORAL | 0 refills | Status: AC

## 2021-08-02 NOTE — Patient Instructions (Signed)
Push fluids and cranberry juice daily

## 2021-08-02 NOTE — Progress Notes (Signed)
Acute Office Visit  Subjective:    Patient ID: Jill Robbins, female    DOB: Feb 03, 1961, 60 y.o.   MRN: [id number]  Chief Complaint  Patient presents with   Hematuria    Hematuria This is a new problem. The current episode started today. The problem has been gradually worsening since onset. She reports no clotting in her urine stream. Her pain is at a severity of 5/10. The pain is mild. She describes her urine color as light pink. Irritative symptoms include frequency. Obstructive symptoms include incomplete emptying. Obstructive symptoms do not include dribbling. Associated symptoms include genital pain.  He has never had kidney stones and has no GI symptoms whatsoever.  No fever or chills or dysuria recently.  He does however have the frequency as noted and the suprapubic discomfort.  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)   Family history of adverse reaction to anesthesia    uncle conplications with surgery, then family was all tested   HTN (hypertension)    Osteopenia    Succinylcholine adverse reaction     Past Surgical History:  Procedure Laterality Date   adnoids     as a child   BREAST BIOPSY Right 12/08/2017   BREAST BIOPSY Left    BREAST LUMPECTOMY WITH RADIOACTIVE SEED LOCALIZATION Left 08/27/2020   Procedure: LEFT BREAST LUMPECTOMY WITH RADIOACTIVE SEED LOCALIZATION;  Surgeon: Erroll Luna, MD;  Location: [place];  Service: General;  Laterality: Left;   ENDOMETRIAL ABLATION     LABIOPLASTY Bilateral 09/22/2017   Procedure: LABIAL reduction;  Surgeon: Tyson Dense, MD;  Location: [facility];  Service: Gynecology;  Laterality: Bilateral;   RHINOPLASTY  1995   sclero theray  09/21/2016   varicose viens   WISDOM TOOTH EXTRACTION      No family history on  file.  Social History   Socioeconomic History   Marital status: Married    Spouse name: Not on file   Number of children: Not on file   Years of education: Not on file   Highest education level: Not on file  Occupational History   Not on file  Tobacco Use   Smoking status: Never   Smokeless tobacco: Never  Vaping Use   Vaping Use: Never used  Substance and Sexual Activity   Alcohol use: Yes    Comment: social   Drug use: No   Sexual activity: Yes    Birth control/protection: Surgical    Comment: husband has had vasectomy and pt ablation  Other Topics Concern   Not on file  Social History Narrative   Not on file   Social Determinants of Health   Financial Resource Strain: Not on file  Food Insecurity: Not on file  Transportation Needs: Not on file  Physical Activity: Not on file  Stress: Not on file  Social Connections: Not on file  Intimate Partner Violence: Not on file    Outpatient Medications Prior to Visit  Medication Sig Dispense Refill   busPIRone (BUSPAR) 7.5 MG tablet Take 1 tablet (7.5 mg total) by mouth 2 (two) times daily. 180 tablet 1   Calcium Carb-Cholecalciferol (CALCIUM-VITAMIN D) 500-200 MG-UNIT tablet Take 2 tablets by mouth daily.      cetirizine (ZYRTEC) 10 MG tablet Take 1  tablet (10 mg total) by mouth daily. 90 tablet 3   estradiol (VIVELLE-DOT) 0.1 MG/24HR patch once a week.  3   GLUCOSAMINE-CHONDROITIN PO Take 2 tablets by mouth daily.     Multiple Vitamin (MULTIVITAMIN WITH MINERALS) TABS tablet Take 2 tablets by mouth daily.     Omega-3 Fatty Acids (FISH OIL PO) Take 2 capsules by mouth daily.     progesterone (PROMETRIUM) 100 MG capsule Take 100 mg by mouth every evening.      triamterene-hydrochlorothiazide (MAXZIDE-25) 37.5-25 MG tablet Take 1 tablet by mouth daily. 90 tablet 1   valACYclovir (VALTREX) 1000 MG tablet TAKE 2 TABLETS BY MOUTH NOW  THEN REPEATIN 12 HOURS AS DIRECTED (Patient not taking: Reported on 06/29/2021) 30 tablet 1    No facility-administered medications prior to visit.    Allergies  Allergen Reactions   Succinylcholine Other (See Comments)    Difficultly waking up   Sulfa Antibiotics Rash   Other     [place] - lips swelling, ulcers inside the mouth   Peanut-Containing Drug Products Itching    Review of Systems  Genitourinary:  Positive for frequency, hematuria and incomplete emptying.      Objective:    Physical Exam Vitals reviewed.  Constitutional:      General: She is not in acute distress.    Appearance: She is well-developed.  HENT:     Head: Normocephalic and atraumatic.     Right Ear: Hearing normal.     Left Ear: Hearing normal.     Nose: Nose normal.  Eyes:     General: Lids are normal. No scleral icterus.       Right eye: No discharge.        Left eye: No discharge.     Conjunctiva/sclera: Conjunctivae normal.  Cardiovascular:     Rate and Rhythm: Normal rate and regular rhythm.     Heart sounds: Normal heart sounds.  Pulmonary:     Effort: Pulmonary effort is normal. No respiratory distress.  Abdominal:     Palpations: Abdomen is soft.     Tenderness: There is no abdominal tenderness. There is no right CVA tenderness, left CVA tenderness or guarding.     Comments: No CVAT  Skin:    Findings: No lesion or rash.  Neurological:     General: No focal deficit present.     Mental Status: She is alert and oriented to person, place, and time.  Psychiatric:        Mood and Affect: Mood normal.        Speech: Speech normal.        Behavior: Behavior normal.        Thought Content: Thought content normal.        Judgment: Judgment normal.    BP 124/80 (BP Location: Right Arm, Patient Position: Sitting, Cuff Size: Normal)   Pulse 97   Temp 98.1 F (36.7 C) (Temporal)   Wt 143 lb (64.9 kg)   SpO2 99%   BMI 20.52 kg/m  Wt Readings from Last 3 Encounters:  08/02/21 143 lb (64.9 kg)  06/29/21 141 lb 8 oz (64.2 kg)  10/23/20 140 lb 3.2 oz (63.6 kg)    Health  Maintenance Due  Topic Date Due   COVID-19 Vaccine (1) Never done    There are no preventive care reminders to display for this patient.   Lab Results  Component Value Date   TSH 1.890 10/23/2020   Lab Results  Component Value Date  WBC 4.9 10/23/2020   HGB 14.6 10/23/2020   HCT 43.6 10/23/2020   MCV 94 10/23/2020   PLT 260 10/23/2020   Lab Results  Component Value Date   NA 141 06/29/2021   K 3.7 06/29/2021   CO2 27 06/29/2021   GLUCOSE 74 06/29/2021   BUN 18 06/29/2021   CREATININE 0.78 06/29/2021   BILITOT 0.9 10/23/2020   ALKPHOS 63 10/23/2020   AST 22 10/23/2020   ALT 16 10/23/2020   PROT 7.0 10/23/2020   ALBUMIN 4.9 10/23/2020   CALCIUM 9.7 06/29/2021   ANIONGAP 12 08/24/2020   EGFR 88 06/29/2021   Lab Results  Component Value Date   CHOL 184 10/23/2020   Lab Results  Component Value Date   HDL 73 10/23/2020   Lab Results  Component Value Date   LDLCALC 97 10/23/2020   Lab Results  Component Value Date   TRIG 78 10/23/2020   Lab Results  Component Value Date   CHOLHDL 2.5 10/23/2020   No results found for: HGBA1C     Assessment & Plan:   Problem List Items Addressed This Visit   None Visit Diagnoses     Flank pain    -  Primary   Relevant Orders   DG Abd 1 View   Other microscopic hematuria       Relevant Orders   DG Abd 1 View   Cystitis       Relevant Orders   DG Abd 1 View   POCT urinalysis dipstick (Completed)   CULTURE, URINE COMPREHENSIVE        Meds ordered this encounter  Medications   ciprofloxacin (CIPRO) 500 MG tablet    Sig: Take 1 tablet (500 mg total) by mouth 2 (two) times daily for 10 days.    Dispense:  10 tablet    Refill:  0     April Miller, [place]

## 2021-08-10 LAB — CULTURE, URINE COMPREHENSIVE

## 2021-08-12 ENCOUNTER — Ambulatory Visit: Admitting: Family Medicine

## 2021-08-18 ENCOUNTER — Other Ambulatory Visit: Payer: Self-pay | Admitting: Surgery

## 2021-08-18 DIAGNOSIS — Z1239 Encounter for other screening for malignant neoplasm of breast: Secondary | ICD-10-CM

## 2021-10-26 ENCOUNTER — Encounter (HOSPITAL_COMMUNITY): Payer: Self-pay

## 2021-10-26 ENCOUNTER — Encounter: Payer: Self-pay | Admitting: Physician Assistant

## 2021-11-02 ENCOUNTER — Encounter: Payer: Self-pay | Admitting: Family Medicine

## 2021-11-02 ENCOUNTER — Other Ambulatory Visit: Payer: Self-pay

## 2021-11-02 ENCOUNTER — Ambulatory Visit (INDEPENDENT_AMBULATORY_CARE_PROVIDER_SITE_OTHER): Admitting: Family Medicine

## 2021-11-02 VITALS — BP 108/68 | HR 67 | Temp 98.7°F | Resp 16 | Wt 144.0 lb

## 2021-11-02 DIAGNOSIS — J069 Acute upper respiratory infection, unspecified: Secondary | ICD-10-CM

## 2021-11-02 DIAGNOSIS — J301 Allergic rhinitis due to pollen: Secondary | ICD-10-CM | POA: Diagnosis not present

## 2021-11-02 DIAGNOSIS — F419 Anxiety disorder, unspecified: Secondary | ICD-10-CM | POA: Diagnosis not present

## 2021-11-02 DIAGNOSIS — I10 Essential (primary) hypertension: Secondary | ICD-10-CM

## 2021-11-02 DIAGNOSIS — Z0000 Encounter for general adult medical examination without abnormal findings: Secondary | ICD-10-CM

## 2021-11-02 MED ORDER — BUSPIRONE HCL 7.5 MG PO TABS: 7.5000 mg | ORAL_TABLET | Freq: Two times a day (BID) | ORAL | 1 refills | Status: DC

## 2021-11-02 MED ORDER — TRIAMTERENE-HCTZ 37.5-25 MG PO TABS: 1.0000 | ORAL_TABLET | Freq: Every day | ORAL | 1 refills | Status: DC

## 2021-11-02 MED ORDER — CETIRIZINE HCL 10 MG PO TABS: 10.0000 mg | ORAL_TABLET | Freq: Every day | ORAL | 3 refills | Status: DC

## 2021-11-02 MED ORDER — FLUTICASONE PROPIONATE 50 MCG/ACT NA SUSP: 2.0000 | Freq: Every day | NASAL | 6 refills | Status: DC

## 2021-11-02 NOTE — Assessment & Plan Note (Signed)
Well controlled Continue current medications Recheck metabolic panel F/u in 6 months  

## 2021-11-02 NOTE — Assessment & Plan Note (Signed)
Chronic and well controlled Continue buspar at current dose 

## 2021-11-02 NOTE — Progress Notes (Signed)
Complete physical exam   Patient: Jill Robbins   DOB: 08/16/1961   62 y.o. Female  MRN: [id number] Visit Date: 11/02/2021  Today's healthcare provider: Lavon Paganini, MD   Chief Complaint  Patient presents with   Annual Exam   Subjective    Jill Robbins is a 61 y.o. female who presents today for a complete physical exam.  She reports consuming a general diet. Home exercise routine includes walking. She generally feels well. She reports sleeping well. She does not have additional problems to discuss today.  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)   Family history of adverse reaction to anesthesia    uncle conplications with surgery, then family was all tested   HTN (hypertension)    Osteopenia    Succinylcholine adverse reaction    Past Surgical History:  Procedure Laterality Date   adnoids     as a child   BREAST BIOPSY Right 12/08/2017   BREAST BIOPSY Left    BREAST LUMPECTOMY WITH RADIOACTIVE SEED LOCALIZATION Left 08/27/2020   Procedure: LEFT BREAST LUMPECTOMY WITH RADIOACTIVE SEED LOCALIZATION;  Surgeon: Erroll Luna, MD;  Location: [facility];  Service: General;  Laterality: Left;   ENDOMETRIAL ABLATION     LABIOPLASTY Bilateral 09/22/2017   Procedure: LABIAL reduction;  Surgeon: Tyson Dense, MD;  Location: [facility];  Service: Gynecology;  Laterality: Bilateral;   RHINOPLASTY  1995   sclero theray  09/21/2016   varicose viens   WISDOM TOOTH EXTRACTION     Social History   Socioeconomic History   Marital status: Married    Spouse name: Not on file   Number of children: Not on file   Years of education: Not on file   Highest education level: Not on file  Occupational History   Not on file  Tobacco Use   Smoking status: Never   Smokeless  tobacco: Never  Vaping Use   Vaping Use: Never used  Substance and Sexual Activity   Alcohol use: Yes    Comment: social   Drug use: No   Sexual activity: Yes    Birth control/protection: Surgical    Comment: husband has had vasectomy and pt ablation  Other Topics Concern   Not on file  Social History Narrative   Not on file   Social Determinants of Health   Financial Resource Strain: Not on file  Food Insecurity: Not on file  Transportation Needs: Not on file  Physical Activity: Not on file  Stress: Not on file  Social Connections: Not on file  Intimate Partner Violence: Not on file   No family status information on file.   No family history on file. Allergies  Allergen Reactions   Succinylcholine Other (See Comments)    Difficultly waking up   Sulfa Antibiotics Rash   Other     Mellon - lips swelling, ulcers inside the mouth   Peanut-Containing Drug Products Itching    Patient Care Team: Virginia Crews, MD as PCP - General (Family Medicine)   Medications: Outpatient Medications Prior to Visit  Medication Sig   Calcium Carb-Cholecalciferol (CALCIUM-VITAMIN D) 500-200 MG-UNIT tablet Take 2 tablets by mouth daily.    DOTTI 0.05 MG/24HR patch Place 1 patch  onto the skin 2 (two) times a week.   GLUCOSAMINE-CHONDROITIN PO Take 2 tablets by mouth daily.   Multiple Vitamin (MULTIVITAMIN WITH MINERALS) TABS tablet Take 2 tablets by mouth daily.   Omega-3 Fatty Acids (FISH OIL PO) Take 2 capsules by mouth daily.   progesterone (PROMETRIUM) 100 MG capsule Take 100 mg by mouth every evening.    [DISCONTINUED] busPIRone (BUSPAR) 7.5 MG tablet Take 1 tablet (7.5 mg total) by mouth 2 (two) times daily.   [DISCONTINUED] cetirizine (ZYRTEC) 10 MG tablet Take 1 tablet (10 mg total) by mouth daily.   [DISCONTINUED] estradiol (VIVELLE-DOT) 0.1 MG/24HR patch 1 patch once a week.   [DISCONTINUED] triamterene-hydrochlorothiazide (MAXZIDE-25) 37.5-25 MG tablet Take 1 tablet by  mouth daily.   [DISCONTINUED] valACYclovir (VALTREX) 1000 MG tablet TAKE 2 TABLETS BY MOUTH NOW  THEN REPEATIN 12 HOURS AS DIRECTED (Patient not taking: Reported on 06/29/2021)   No facility-administered medications prior to visit.    Review of Systems  HENT:  Positive for congestion, mouth sores and sinus pressure.   Genitourinary:  Positive for enuresis and frequency.  Neurological:  Positive for dizziness.   Last CBC Lab Results  Component Value Date   WBC 4.9 10/23/2020   HGB 14.6 10/23/2020   HCT 43.6 10/23/2020   MCV 94 10/23/2020   MCH 31.5 10/23/2020   RDW 11.9 10/23/2020   PLT 260 70/26/3785   Last metabolic panel Lab Results  Component Value Date   GLUCOSE 74 06/29/2021   NA 141 06/29/2021   K 3.7 06/29/2021   CL 101 06/29/2021   CO2 27 06/29/2021   BUN 18 06/29/2021   CREATININE 0.78 06/29/2021   EGFR 88 06/29/2021   CALCIUM 9.7 06/29/2021   PROT 7.0 10/23/2020   ALBUMIN 4.9 10/23/2020   LABGLOB 2.1 10/23/2020   AGRATIO 2.3 (H) 10/23/2020   BILITOT 0.9 10/23/2020   ALKPHOS 63 10/23/2020   AST 22 10/23/2020   ALT 16 10/23/2020   ANIONGAP 12 08/24/2020   Last lipids Lab Results  Component Value Date   CHOL 184 10/23/2020   HDL 73 10/23/2020   LDLCALC 97 10/23/2020   TRIG 78 10/23/2020   CHOLHDL 2.5 10/23/2020   Last thyroid functions Lab Results  Component Value Date   TSH 1.890 10/23/2020      Objective    BP 108/68 (BP Location: Left Arm, Patient Position: Sitting, Cuff Size: Large)    Pulse 67    Temp 98.7 F (37.1 C) (Temporal)    Resp 16    Wt 144 lb (65.3 kg)    SpO2 99%    BMI 20.66 kg/m  BP Readings from Last 3 Encounters:  11/02/21 108/68  08/02/21 124/80  06/29/21 100/76   Wt Readings from Last 3 Encounters:  11/02/21 144 lb (65.3 kg)  08/02/21 143 lb (64.9 kg)  06/29/21 141 lb 8 oz (64.2 kg)   Physical Exam Constitutional:      General: She is not in acute distress.    Appearance: Normal appearance. She is not  diaphoretic.  HENT:     Head: Normocephalic and atraumatic.     Right Ear: Tympanic membrane, ear canal and external ear normal.     Left Ear: Tympanic membrane, ear canal and external ear normal.     Nose: Nose normal.     Mouth/Throat:     Mouth: Mucous membranes are moist.     Pharynx: No oropharyngeal exudate.  Eyes:     General: No scleral icterus.  Extraocular Movements: Extraocular movements intact.     Conjunctiva/sclera: Conjunctivae normal.  Cardiovascular:     Rate and Rhythm: Normal rate and regular rhythm.     Pulses: Normal pulses.     Heart sounds: Normal heart sounds. No murmur heard. Pulmonary:     Effort: Pulmonary effort is normal. No respiratory distress.     Breath sounds: Normal breath sounds. No wheezing.  Abdominal:     General: There is no distension.     Palpations: Abdomen is soft.     Tenderness: There is no abdominal tenderness.  Musculoskeletal:     Cervical back: Neck supple.     Right lower leg: No edema.     Left lower leg: No edema.  Lymphadenopathy:     Cervical: No cervical adenopathy.  Skin:    General: Skin is warm and dry.     Findings: No rash.  Neurological:     Mental Status: She is alert and oriented to person, place, and time. Mental status is at baseline.  Psychiatric:        Mood and Affect: Mood normal.        Behavior: Behavior normal.    PHQ 2/9 Scores 11/02/2021 06/29/2021 10/23/2020  PHQ - 2 Score 0 0 0  PHQ- 9 Score _0 Last fall risk screening Fall Risk  11/02/2021  Falls in the past year? 0  Number falls in past yr: 0  Injury with Fall? 0  Risk for fall due to : No Fall Risks  Follow up Falls evaluation completed   Last Audit-C alcohol use screening Alcohol Use Disorder Test (AUDIT) 11/02/2021  1. How often do you have a drink containing alcohol? 1  2. How many drinks containing alcohol do you have on a typical day when you are drinking? 0  3. How often do you have six or more drinks on one occasion? 0   AUDIT-C Score 1   A score of 3 or more in women, and 4 or more in men indicates increased risk for alcohol abuse, EXCEPT if all of the points are from question 1   No results found for any visits on 11/02/21.  Assessment & Plan    Routine Health Maintenance and Physical Exam  Exercise Activities and Dietary recommendations  Goals   None     Immunization History  Administered Date(s) Administered   Influenza,inj,Quad PF,6+ Mos 07/09/2014   Influenza-Unspecified 06/29/2018   PFIZER(Purple Top)SARS-COV-2 Vaccination 11/07/2019, 12/05/2019   Pfizer Covid-19 Vaccine Bivalent Booster 6yr & up 09/03/2020   Tdap 06/21/2013   Zoster Recombinat (Shingrix) 08/25/2017, 11/24/2017    Health Maintenance  Topic Date Due   INFLUENZA VACCINE  12/10/2021 (Originally 04/12/2021)   PAP SMEAR-Modifier  05/16/2023   MAMMOGRAM  06/22/2023   TETANUS/TDAP  06/22/2023   COLONOSCOPY (Pts 45-452yrInsurance coverage will need to be confirmed)  08/01/2024   COVID-19 Vaccine  Completed   Hepatitis C Screening  Completed   HIV Screening  Completed   Zoster Vaccines- Shingrix  Completed   HPV VACCINES  Aged Out    Discussed health benefits of physical activity, and encouraged her to engage in regular exercise appropriate for her age and condition.  Problem List Items Addressed This Visit       Cardiovascular and Mediastinum   Essential hypertension    Well controlled Continue current medications Recheck metabolic panel F/u in 6 months       Relevant Medications   triamterene-hydrochlorothiazide (MAXZIDE-25) 37.5-25  MG tablet   Other Relevant Orders   Lipid panel   Comprehensive metabolic panel     Respiratory   Allergic rhinitis due to pollen   Relevant Medications   cetirizine (ZYRTEC) 10 MG tablet     Other   Anxiety    Chronic and well controlled Continue buspar at current dose      Relevant Medications   busPIRone (BUSPAR) 7.5 MG tablet   Other Visit Diagnoses      Encounter for annual physical exam    -  Primary   Relevant Orders   CBC   Lipid panel   Comprehensive metabolic panel   TSH   Viral URI          - symptoms and exam c/w viral URI - no evidence of strep pharyngitis, CAP, AOM, bacterial sinusitis, or other bacterial infection - discussed symptomatic management, natural course, and return precautions   Return in about 6 months (around 05/02/2022) for chronic disease f/u.     I, Lavon Paganini, MD, have reviewed all documentation for this visit. The documentation on 11/02/21 for the exam, diagnosis, procedures, and orders are all accurate and complete.   Empress Newmann, Dionne Bucy, MD, MPH [facility]

## 2021-11-05 LAB — COMPREHENSIVE METABOLIC PANEL
ALT: 20 IU/L (ref 0–32)
AST: 23 IU/L (ref 0–40)
Albumin/Globulin Ratio: 1.8 (ref 1.2–2.2)
Albumin: 4.6 g/dL (ref 3.8–4.9)
Alkaline Phosphatase: 69 IU/L (ref 44–121)
BUN/Creatinine Ratio: 17 (ref 9–23)
BUN: 15 mg/dL (ref 6–24)
Bilirubin Total: 0.6 mg/dL (ref 0.0–1.2)
CO2: 25 mmol/L (ref 20–29)
Calcium: 9.4 mg/dL (ref 8.7–10.2)
Chloride: 101 mmol/L (ref 96–106)
Creatinine, Ser: 0.9 mg/dL (ref 0.57–1.00)
Globulin, Total: 2.5 g/dL (ref 1.5–4.5)
Glucose: 93 mg/dL (ref 70–99)
Potassium: 4 mmol/L (ref 3.5–5.2)
Sodium: 140 mmol/L (ref 134–144)
Total Protein: 7.1 g/dL (ref 6.0–8.5)
eGFR: 74 mL/min/{1.73_m2} (ref >59)

## 2021-11-05 LAB — CBC
Hematocrit: 40.6 % (ref 34.0–46.6)
Hemoglobin: 14.1 g/dL (ref 11.1–15.9)
MCH: 32 pg (ref 26.6–33.0)
MCHC: 34.7 g/dL (ref 31.5–35.7)
MCV: 92 fL (ref 79–97)
Platelets: 262 10*3/uL (ref 150–450)
RBC: 4.41 x10E6/uL (ref 3.77–5.28)
RDW: 11.8 % (ref 11.7–15.4)
WBC: 5.2 10*3/uL (ref 3.4–10.8)

## 2021-11-05 LAB — LIPID PANEL
Chol/HDL Ratio: 2.6 ratio (ref 0.0–4.4)
Cholesterol, Total: 199 mg/dL (ref 100–199)
HDL: 78 mg/dL (ref >39)
LDL Chol Calc (NIH): 107 mg/dL — ABNORMAL HIGH (ref 0–99)
Triglycerides: 81 mg/dL (ref 0–149)
VLDL Cholesterol Cal: 14 mg/dL (ref 5–40)

## 2021-11-05 LAB — TSH: TSH: 2.62 u[IU]/mL (ref 0.450–4.500)

## 2021-12-12 IMAGING — CR DG ABDOMEN 1V
1 series · 2 of 2 positions shown · non-contrast
Comparison: None.

CLINICAL DATA: Flank pain and hematuria.  Cystitis.

EXAM:
ABDOMEN - 1 VIEW

[Series 1: dg abd 1 view · 0.14mm/px · 2 of 2 slices shown]
[im 1/2]
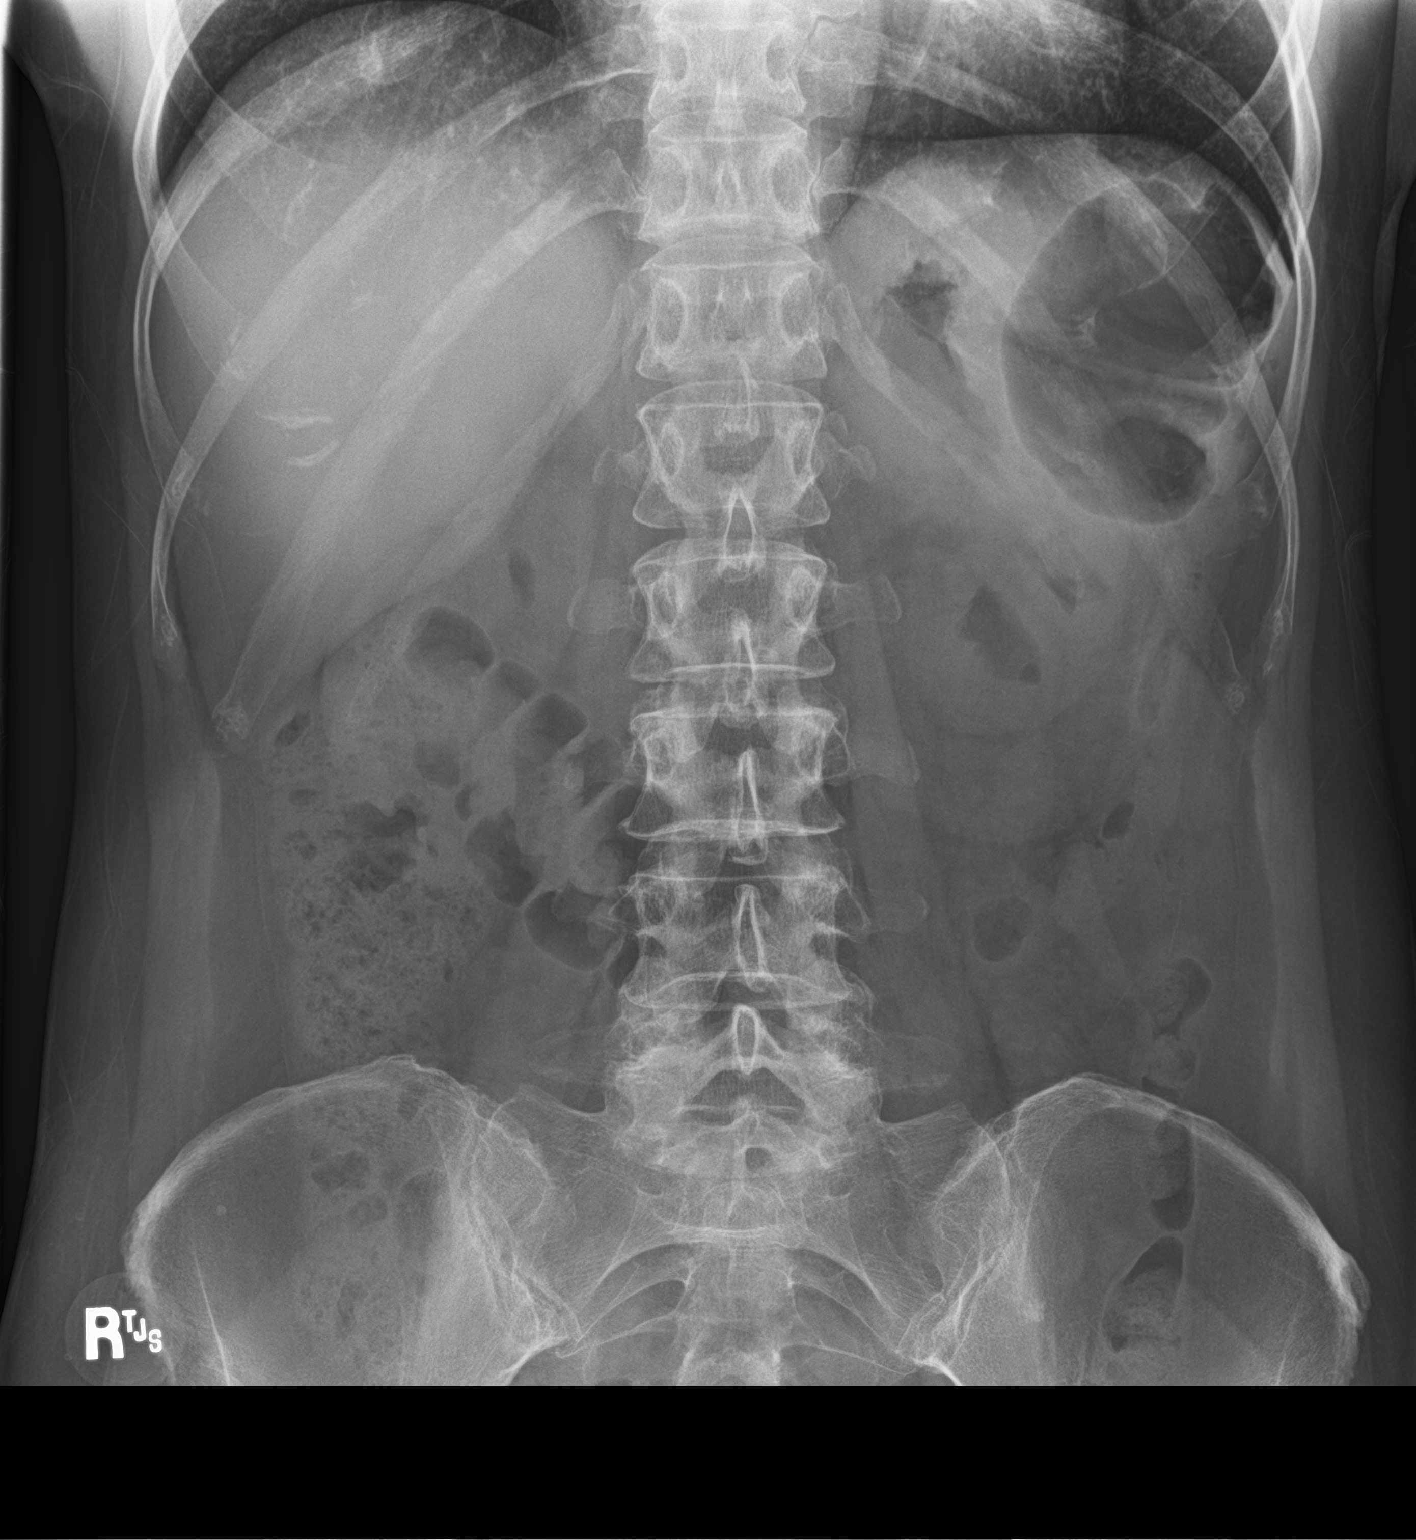
[im 2/2]
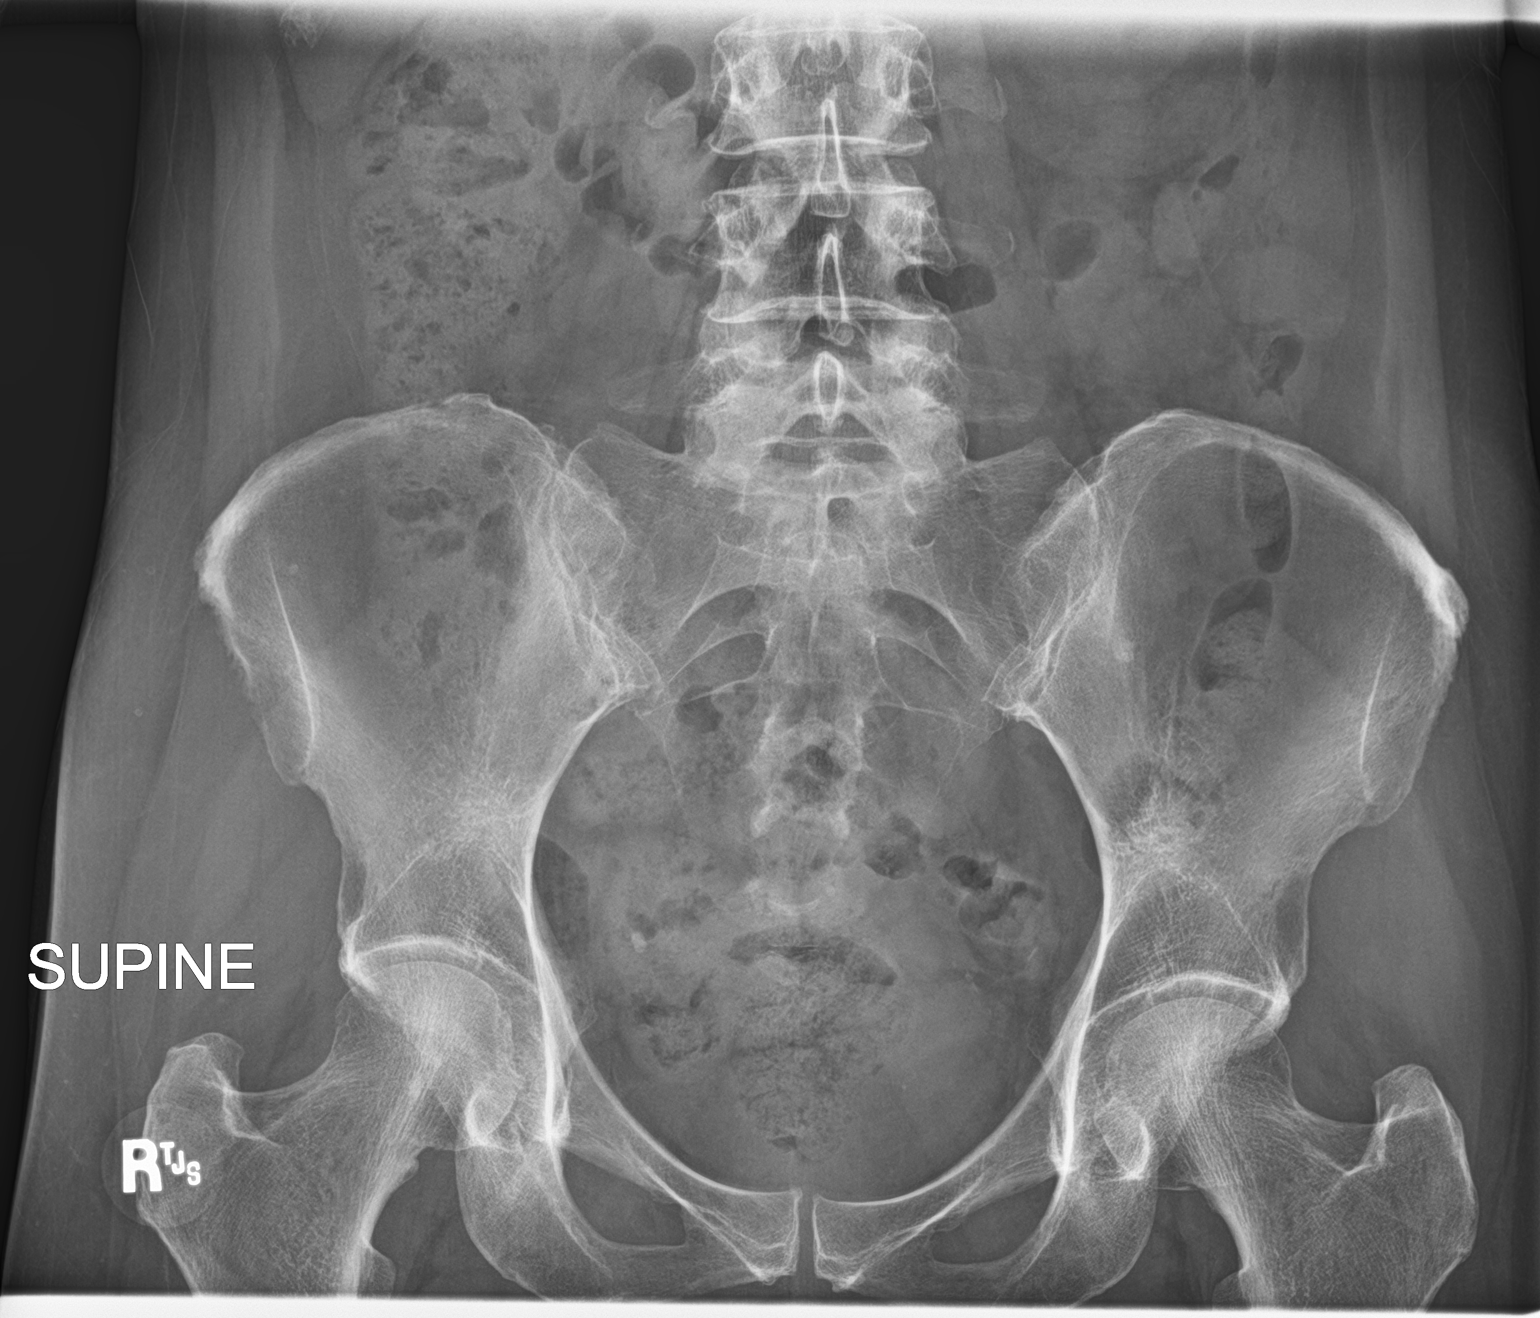

[2 of 2 positions shown; findings below may reference images not displayed]

FINDINGS: No radiopaque calculi project over the renal beds. There is a 5 mm
calcification in the pelvis to the right of midline. Nonobstructive
bowel gas pattern. Moderate stool in the ascending and rectosigmoid
colon. No small bowel distention. No concerning intraabdominal mass
effect. The lower most lung bases are clear. No osseous
abnormalities are seen.
IMPRESSION: 1. A 5 mm calcification in the pelvis to the right of midline may
represent a phlebolith or ureteral stone. No radiopaque calculi
project over the renal beds.
2. Moderate stool in the ascending and rectosigmoid colon.

## 2022-01-26 ENCOUNTER — Telehealth: Payer: Self-pay | Admitting: Family Medicine

## 2022-01-26 MED ORDER — FLUTICASONE PROPIONATE 50 MCG/ACT NA SUSP: 2.0000 | Freq: Every day | NASAL | 6 refills | Status: DC

## 2022-01-26 NOTE — Telephone Encounter (Signed)
[facility] faxed refill request for the following medications: ? ?fluticasone (FLONASE) 50 MCG/ACT nasal spray  ? ?Please advise. ? ?

## 2022-02-15 ENCOUNTER — Ambulatory Visit (INDEPENDENT_AMBULATORY_CARE_PROVIDER_SITE_OTHER): Admitting: Family Medicine

## 2022-02-15 ENCOUNTER — Encounter: Payer: Self-pay | Admitting: Family Medicine

## 2022-02-15 VITALS — BP 119/80 | HR 68 | Temp 97.7°F | Resp 16 | Wt 143.1 lb

## 2022-02-15 DIAGNOSIS — H109 Unspecified conjunctivitis: Secondary | ICD-10-CM | POA: Diagnosis not present

## 2022-02-15 DIAGNOSIS — J0100 Acute maxillary sinusitis, unspecified: Secondary | ICD-10-CM | POA: Diagnosis not present

## 2022-02-15 DIAGNOSIS — B9689 Other specified bacterial agents as the cause of diseases classified elsewhere: Secondary | ICD-10-CM

## 2022-02-15 MED ORDER — OFLOXACIN 0.3 % OP SOLN: 1.0000 [drp] | Freq: Four times a day (QID) | OPHTHALMIC | 0 refills | Status: DC

## 2022-02-15 MED ORDER — AMOXICILLIN-POT CLAVULANATE 875-125 MG PO TABS: 1.0000 | ORAL_TABLET | Freq: Two times a day (BID) | ORAL | 0 refills | Status: AC

## 2022-02-15 NOTE — Progress Notes (Signed)
I,Sulibeya S Dimas,acting as a Neurosurgeon for Shirlee Latch, MD.,have documented all relevant documentation on the behalf of Shirlee Latch, MD,as directed by  Shirlee Latch, MD while in the presence of Shirlee Latch, MD.   Established patient visit   Patient: Jill Robbins   DOB: 09-29-1960   61 y.o. Female  MRN: [id number] Visit Date: 02/15/2022  Today's healthcare provider: Shirlee Latch, MD   Chief Complaint  Patient presents with   Eye Problem   Subjective    Eye Problem  Both eyes are affected. This is a new problem. The current episode started yesterday. The problem occurs constantly. The problem has been gradually worsening. There was no injury mechanism. The pain is mild. There is No known exposure to pink eye. She Does not wear contacts. Associated symptoms include blurred vision, an eye discharge, eye redness and a recent URI. Pertinent negatives include no itching. She has tried eye drops for the symptoms. The treatment provided no relief.   Patient reports negative home COVID test. She reports using artifical tear drops and OTC antihistamine reports no symptom improvement.   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   Calcium Carb-Cholecalciferol (CALCIUM-VITAMIN D) 500-200 MG-UNIT tablet Take 2 tablets by mouth daily.    cetirizine (ZYRTEC) 10 MG tablet Take 1 tablet (10 mg total) by mouth daily.   DOTTI 0.05 MG/24HR patch Place 1 patch onto the skin 2 (two) times a week.   fluticasone (FLONASE) 50 MCG/ACT nasal spray Place 2 sprays into both nostrils daily.   GLUCOSAMINE-CHONDROITIN PO Take 2 tablets by mouth daily.   Multiple Vitamin (MULTIVITAMIN WITH MINERALS) TABS tablet Take 2 tablets by mouth daily.   Omega-3 Fatty Acids (FISH OIL PO)  Take 2 capsules by mouth daily.   progesterone (PROMETRIUM) 100 MG capsule Take 100 mg by mouth every evening.    triamterene-hydrochlorothiazide (MAXZIDE-25) 37.5-25 MG tablet Take 1 tablet by mouth daily.   No facility-administered medications prior to visit.    Review of Systems  HENT:  Positive for congestion, ear pain, sinus pressure, sinus pain and sore throat. Negative for rhinorrhea.   Eyes:  Positive for blurred vision, discharge and redness. Negative for itching.       Objective    BP 119/80 (BP Location: Left Arm, Patient Position: Sitting, Cuff Size: Normal)   Pulse 68   Temp 97.7 F (36.5 C) (Oral)   Resp 16   Wt 143 lb 1.6 oz (64.9 kg)   BMI 20.53 kg/m  BP Readings from Last 3 Encounters:  02/15/22 119/80  11/02/21 108/68  08/02/21 124/80   Wt Readings from Last 3 Encounters:  02/15/22 143 lb 1.6 oz (64.9 kg)  11/02/21 144 lb (65.3 kg)  08/02/21 143 lb (64.9 kg)      Physical Exam Vitals reviewed.  Constitutional:      General: She is not in acute distress.    Appearance: Normal appearance. She is well-developed. She is not diaphoretic.  HENT:     Head: Normocephalic and atraumatic.     Salivary Glands: Right salivary gland is diffusely enlarged and tender. Left salivary gland is diffusely enlarged and tender.     Right Ear: Tympanic membrane, ear canal and external ear normal.  Left Ear: Tympanic membrane, ear canal and external ear normal.     Nose: Congestion present.     Right Sinus: Maxillary sinus tenderness present.     Left Sinus: Maxillary sinus tenderness present.     Mouth/Throat:     Mouth: Mucous membranes are moist.     Pharynx: Oropharynx is clear. No oropharyngeal exudate or posterior oropharyngeal erythema.  Eyes:     General: No scleral icterus.       Right eye: Discharge present.        Left eye: Discharge present.    Extraocular Movements: Extraocular movements intact.     Conjunctiva/sclera:     Right eye: Right conjunctiva  is injected.     Left eye: Left conjunctiva is injected.  Neck:     Thyroid: No thyromegaly.  Cardiovascular:     Rate and Rhythm: Normal rate and regular rhythm.     Pulses: Normal pulses.     Heart sounds: Normal heart sounds. No murmur heard. Pulmonary:     Effort: Pulmonary effort is normal. No respiratory distress.     Breath sounds: Normal breath sounds. No wheezing, rhonchi or rales.  Musculoskeletal:     Cervical back: Neck supple.     Right lower leg: No edema.     Left lower leg: No edema.  Lymphadenopathy:     Cervical: Cervical adenopathy present.  Skin:    General: Skin is warm and dry.     Findings: No rash.  Neurological:     Mental Status: She is alert and oriented to person, place, and time. Mental status is at baseline.  Psychiatric:        Mood and Affect: Mood normal.        Behavior: Behavior normal.      No results found for any visits on 02/15/22.  Assessment & Plan     1. Acute non-recurrent maxillary sinusitis - symptoms and exam c/w sinusitis   - no evidence of AOM, CAP, strep pharyngitis, or other infection -Likely started as viral URI - given duration of symptoms, suspect bacterial etiology - will treat with Augmentin x7d - discussed symptomatic management (flonase, decongestants, etc), natural course, and return precautions    2. Bacterial conjunctivitis of both eyes -Her eyes are consistent with bacterial conjunctivitis with profuse crusting and drainage - Discussed that the Augmentin to treat her sinus infection will likely also treat her conjunctivitis, but given the significant irritation, she may be more comfortable with eyedrops, so these were prescribed as well - No red flag symptoms, but did discuss these and the need to see ophthalmology if they occur  Meds ordered this encounter  Medications   ofloxacin (OCUFLOX) 0.3 % ophthalmic solution    Sig: Place 1 drop into both eyes 4 (four) times daily.    Dispense:  5 mL    Refill:  0    amoxicillin-clavulanate (AUGMENTIN) 875-125 MG tablet    Sig: Take 1 tablet by mouth 2 (two) times daily for 7 days.    Dispense:  14 tablet    Refill:  0     Return if symptoms worsen or fail to improve.      I, Shirlee Latch, MD, have reviewed all documentation for this visit. The documentation on 02/15/22 for the exam, diagnosis, procedures, and orders are all accurate and complete.   Ayako Tapanes, Marzella Schlein, MD, MPH [facility]

## 2022-03-08 ENCOUNTER — Telehealth: Payer: Self-pay | Admitting: Family Medicine

## 2022-03-08 ENCOUNTER — Ambulatory Visit (INDEPENDENT_AMBULATORY_CARE_PROVIDER_SITE_OTHER): Admitting: Family Medicine

## 2022-03-08 ENCOUNTER — Encounter: Payer: Self-pay | Admitting: Family Medicine

## 2022-03-08 VITALS — BP 119/82 | HR 71 | Temp 98.6°F | Resp 16 | Wt 145.0 lb

## 2022-03-08 DIAGNOSIS — J0110 Acute frontal sinusitis, unspecified: Secondary | ICD-10-CM

## 2022-03-08 MED ORDER — DOXYCYCLINE HYCLATE 100 MG PO TABS: 100.0000 mg | ORAL_TABLET | Freq: Two times a day (BID) | ORAL | 0 refills | Status: AC

## 2022-03-08 NOTE — Telephone Encounter (Signed)
Patient advised.

## 2022-03-08 NOTE — Progress Notes (Signed)
   SUBJECTIVE:   CHIEF COMPLAINT / HPI:   UPPER RESPIRATORY TRACT INFECTION - seen previously 6/6 for same. Rx augmentin. Had some relief but with return of headache/intense R sided sinus pressure and watery red eyes over the weekend with point tenderness over R frontal sinus.  Fever: no Cough: no Shortness of breath: no Wheezing: no Chest pain: no Chest tightness: no Chest congestion: no Nasal congestion: yes Runny nose: yes Sinus pressure: yes Headache: yes Face pain: yes Toothache: no Ear pain: yes "right Ear pressure: yes "right Eyes red/itching:yes Eye drainage/crusting: yes  Vomiting: no Rash: no Sick contacts: no Context: worse Relief with OTC cold/cough medications: no  Treatments attempted:  mucinex-D, antihistamine   OBJECTIVE:   BP 119/82 (BP Location: Left Arm, Patient Position: Sitting, Cuff Size: Normal)   Pulse 71   Temp 98.6 F (37 C) (Oral)   Resp 16   Wt 145 lb (65.8 kg)   SpO2 99%   BMI 20.81 kg/m   Gen: well appearing, in NAD HEENT: orophyarynx clear without exudate, mildly erythematous. Uvula midline. No tonsillar enlargement. Good dentition. TM visible b/l without bulging, erythema, purulence. No cervical or supraclavicular lymphadenopathy. Point tenderness over R frontal and maxillary sinuses. Card: RRR Lungs: CTAB Ext: WWP, no edema   ASSESSMENT/PLAN:   Sinusitis Some relief after previous antibiotics but with recent worsening over the weekend and point tenderness over R frontal and maxillary sinuses on exam, reasonable to repeat antibiotics, will trial doxycycline given recent augmentin use.   Caro Laroche, DO

## 2022-04-08 ENCOUNTER — Other Ambulatory Visit: Payer: Self-pay | Admitting: Obstetrics and Gynecology

## 2022-04-08 DIAGNOSIS — Z1231 Encounter for screening mammogram for malignant neoplasm of breast: Secondary | ICD-10-CM

## 2022-04-13 ENCOUNTER — Telehealth: Payer: Self-pay | Admitting: Family Medicine

## 2022-04-13 DIAGNOSIS — Z1379 Encounter for other screening for genetic and chromosomal anomalies: Secondary | ICD-10-CM

## 2022-04-13 NOTE — Telephone Encounter (Signed)
Pt called and stated that she would like to have genetic tested done. She would like to know if that can be done with her PCP. Please advise pt at [phone].

## 2022-04-14 NOTE — Telephone Encounter (Signed)
Per patient she was told by her surgeon that she had precancerous cells and she would like to get tested. If provider can guide her in the right direction? Or maybe refer her to a Runner, broadcasting/film/video.

## 2022-04-15 ENCOUNTER — Telehealth: Payer: Self-pay | Admitting: Family Medicine

## 2022-04-15 ENCOUNTER — Other Ambulatory Visit: Payer: Self-pay | Admitting: Family Medicine

## 2022-04-15 DIAGNOSIS — I8390 Asymptomatic varicose veins of unspecified lower extremity: Secondary | ICD-10-CM

## 2022-04-15 NOTE — Telephone Encounter (Signed)
Referral Request - Has patient seen PCP for this complaint? no *If NO, is insurance requiring patient see PCP for this issue before PCP can refer them? Referral for which specialty: vein and vascular Preferred provider/office: [facility] Reason for referral: scleretherapy

## 2022-04-19 ENCOUNTER — Telehealth: Payer: Self-pay

## 2022-04-19 NOTE — Telephone Encounter (Signed)
noted 

## 2022-04-19 NOTE — Telephone Encounter (Signed)
Copied from CRM [id number]. Topic: Referral - Request for Referral >> Apr 19, 2022  1:24 PM Franchot Heidelberg wrote: Has patient seen PCP for this complaint? Yes.   *If NO, is insurance requiring patient see PCP for this issue before PCP can refer them? Referral for which specialty: Vein and Vascular  Preferred provider/office: [facility] (Dr. Wyn Quaker on [address]) Reason for referral: Sclerotherapy >> Apr 19, 2022  1:27 PM Franchot Heidelberg wrote: [phone] Dr. Wyn Quaker

## 2022-04-19 NOTE — Telephone Encounter (Signed)
Patient advised that referral was sent yesterday to [facility]

## 2022-04-19 NOTE — Telephone Encounter (Signed)
Patient advised.

## 2022-04-19 NOTE — Telephone Encounter (Signed)
Copied from CRM [id number]. Topic: General - Other >> Apr 19, 2022  1:23 PM Franchot Heidelberg wrote: Reason for CRM: Pt wants to go to the cancer center for her genetic testing >> Apr 19, 2022  1:26 PM Franchot Heidelberg wrote: Please call [phone] for scheduling. She says she that she may need a referral for this but she is going to call now and see

## 2022-05-04 ENCOUNTER — Encounter: Payer: Self-pay | Admitting: Licensed Clinical Social Worker

## 2022-05-04 ENCOUNTER — Inpatient Hospital Stay

## 2022-05-04 ENCOUNTER — Inpatient Hospital Stay: Attending: Oncology | Admitting: Licensed Clinical Social Worker

## 2022-05-04 DIAGNOSIS — Z800 Family history of malignant neoplasm of digestive organs: Secondary | ICD-10-CM

## 2022-05-04 DIAGNOSIS — N6092 Unspecified benign mammary dysplasia of left breast: Secondary | ICD-10-CM | POA: Diagnosis not present

## 2022-05-04 NOTE — Progress Notes (Signed)
REFERRING PROVIDER: Myles Gip, DO [address] [place],  [place] [address]  PRIMARY PROVIDER:  Virginia Crews, MD  PRIMARY REASON FOR VISIT:  1. 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 Family history of colon cancer      HISTORY OF PRESENT ILLNESS:   Jill Robbins, a 61 y.o. female, was seen for a [facility] cancer genetics consultation at the request of Dr. Ky Robbins due to a family history of cancer.  Jill Robbins presents to clinic today to discuss the possibility of a hereditary predisposition to cancer, genetic testing, and to further clarify her future cancer risks, as well as potential cancer risks for family members.    CANCER HISTORY:  Jill Robbins is a 61 y.o. 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  RISK FACTORS:  Menarche was at age 84.  OCP use for approximately  26  years.  Ovaries intact: yes.  Hysterectomy: no.  HRT use: 4 years. Colonoscopy: yes;  normal, few polyps in past . Mammogram within the last year: yes. Number of breast biopsies: 1 - left breast ALH Up to date with pelvic exams: yes.  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)   Family history of adverse reaction to anesthesia    uncle conplications with surgery, then family was all tested   HTN (hypertension)    Osteopenia    Succinylcholine adverse reaction     Past Surgical History:  Procedure Laterality Date   adnoids     as a child   BREAST BIOPSY Right 12/08/2017   BREAST BIOPSY Left    BREAST LUMPECTOMY WITH RADIOACTIVE SEED LOCALIZATION Left 08/27/2020   Procedure: LEFT BREAST LUMPECTOMY WITH RADIOACTIVE SEED LOCALIZATION;  Surgeon: Jill Luna, MD;  Location: [facility];  Service: General;  Laterality: Left;    ENDOMETRIAL ABLATION     LABIOPLASTY Bilateral 09/22/2017   Procedure: LABIAL reduction;  Surgeon: Jill Dense, MD;  Location: [facility];  Service: Gynecology;  Laterality: Bilateral;   RHINOPLASTY  1995   sclero theray  09/21/2016   varicose viens   WISDOM TOOTH EXTRACTION      FAMILY HISTORY:  We obtained a detailed, 4-generation family history.  Significant diagnoses are listed below: Family History  Problem Relation Age of Onset   Skin cancer Father    Parkinson's disease Father    Colon cancer Maternal Aunt        dx 104s, d. 38s   Jill Robbins has 1 sister, 56, and 1 brother, 65.   Jill Robbins mother is living at 74. Patient has 8 maternal aunts/uncles. One of her aunts had colon cancer in her 52s and passed in her 63s. No other known cancers on this side of the family.  Jill Robbins father passed recently of Parkinson's. He had history of basal cell carcinomas. Paternal grandmother had cancer, either blood or bone cancer at 54.   Ms. Robbins is unaware of previous family history of genetic testing for hereditary cancer risks. There is no reported Ashkenazi Jewish ancestry. There is no known consanguinity.  GENETIC COUNSELING ASSESSMENT: Jill Robbins is a 61 y.o. female with a personal and family history which is not particularly suggestive of  a hereditary cancer syndrome and predisposition to cancer. We, therefore, discussed and recommended the following at today's visit.   DISCUSSION: We discussed that approximately 10% of  cancer is hereditary. Most cases of hereditary breast cancer are associated with BRCA1/BRCA2 mutations, however there are many other genes we can test. Cancers and risks are gene specific. We discussed that testing is beneficial for several reasons including knowing about cancer risks, identifying potential screening and risk-reduction options that may be appropriate, and to understand if other family members could be at risk for cancer and allow them to undergo genetic  testing.   We reviewed the characteristics, features and inheritance patterns of hereditary cancer syndromes. We also discussed genetic testing, including the appropriate family members to test, the process of testing, insurance coverage and turn-around-time for results. We discussed the implications of a negative, positive and/or variant of uncertain significant result. We discussed that Jill Robbins does not meet criteria for testing, but she could consider testing through the [facility] Common Hereditary Cancers+RNA panel if she is still interested.  Based on Jill Robbins's personal and family history of cancer, she does not meet medical criteria for genetic testing. She may have an OOP cost.  PLAN: After considering the risks, benefits, and limitations, Jill Robbins provided informed consent to pursue genetic testing and the blood sample was sent to [facility] for analysis of the Common Hereditary Cancers+RNA panel. Results should be available within approximately 2-3 weeks' time, at which point they will be disclosed by telephone to Jill Robbins, as will any additional recommendations warranted by these results. Jill Robbins will receive a summary of her genetic counseling visit and a copy of her results once available. This information will also be available in Epic.   Jill Robbins questions were answered to her satisfaction today. Our contact information was provided should additional questions or concerns arise. Thank you for the referral and allowing [place] to share in the care of your patient.   Jill Rogue, MS, [facility] Genetic Counselor [email or web address] Phone: [phone]  The patient was seen for a total of 35 minutes in face-to-face genetic counseling.  Dr. Grayland Robbins was available for discussion regarding this case.   _______________________________________________________________________ For Office Staff:  Number of people involved in session: 2 Was an Intern/ student involved with case: yes;  [employer] intern Jill Robbins was also present and assisted.

## 2022-05-30 ENCOUNTER — Ambulatory Visit: Payer: Self-pay | Admitting: Licensed Clinical Social Worker

## 2022-05-30 ENCOUNTER — Encounter: Payer: Self-pay | Admitting: Licensed Clinical Social Worker

## 2022-05-30 DIAGNOSIS — Z1379 Encounter for other screening for genetic and chromosomal anomalies: Secondary | ICD-10-CM | POA: Insufficient documentation

## 2022-05-30 NOTE — Progress Notes (Signed)
HPI:   Jill Robbins was previously seen in the [facility] clinic due to a family history of cancer and concerns regarding a hereditary predisposition to cancer. Please refer to our prior cancer genetics clinic note for more information regarding our discussion, assessment and recommendations, at the time. Jill Robbins recent genetic test results were disclosed to her, as were recommendations warranted by these results. These results and recommendations are discussed in more detail below.  CANCER HISTORY:  Oncology History   No history exists.    FAMILY HISTORY:  We obtained a detailed, 4-generation family history.  Significant diagnoses are listed below: Family History  Problem Relation Age of Onset   Skin cancer Father    Parkinson's disease Father    Colon cancer Maternal Aunt        dx 2s, d. 52s   Jill Robbins has 1 sister, 8, and 1 brother, 24.    Jill Robbins mother is living at 71. Patient has 8 maternal aunts/uncles. One of her aunts had colon cancer in her 80s and passed in her 62s. No other known cancers on this side of the family.   Jill Robbins's father passed recently of Parkinson's. He had history of basal cell carcinomas. Paternal grandmother had cancer, either blood or bone cancer at 107.    Jill Robbins is unaware of previous family history of genetic testing for hereditary cancer risks. There is no reported Ashkenazi Jewish ancestry. There is no known consanguinity.   GENETIC TEST RESULTS:  The [facility] Common Hereditary Cancers+RNA Panel found no pathogenic mutations.   The Common Hereditary Cancers Panel + RNA offered by [facility] includes sequencing and/or deletion duplication testing of the following 47 genes: APC, ATM, AXIN2, BARD1, BMPR1A, BRCA1, BRCA2, BRIP1, CDH1, CDKN2A (p14ARF), CDKN2A (p16INK4a), CKD4, CHEK2, CTNNA1, DICER1, EPCAM (Deletion/duplication testing only), GREM1 (promoter region deletion/duplication testing only), KIT, MEN1, MLH1, MSH2, MSH3, MSH6, MUTYH,  NBN, NF1, NHTL1, PALB2, PDGFRA, PMS2, POLD1, POLE, PTEN, RAD50, RAD51C, RAD51D, SDHB, SDHC, SDHD, SMAD4, SMARCA4. STK11, TP53, TSC1, TSC2, and VHL.  The following genes were evaluated for sequence changes only: SDHA and HOXB13 c.251G>A variant only.   The test report has been scanned into EPIC and is located under the Molecular Pathology section of the Results Review tab.  A portion of the result report is included below for reference. Genetic testing reported out on 05/26/2022.      Even though a pathogenic variant was not identified, possible explanations for the cancer in the family may include: There may be no hereditary risk for cancer in the family. The cancers in her family may be sporadic/familial or due to other genetic and environmental factors. There may be a gene mutation in one of these genes that current testing methods cannot detect but that chance is small. There could be another gene that has not yet been discovered, or that we have not yet tested, that is responsible for the cancer diagnoses in the family.  It is also possible there is a hereditary cause for the cancer in the family that Jill Robbins did not inherit  Therefore, it is important to remain in touch with cancer genetics in the future so that we can continue to offer Jill Robbins the most up to date genetic testing.   ADDITIONAL GENETIC TESTING:  We discussed with Ms. Sedlar that her genetic testing was fairly extensive.  If there are additional relevant genes identified to increase cancer risk that can be analyzed in the future, we would  be happy to discuss and coordinate this testing at that time.    CANCER SCREENING RECOMMENDATIONS:  Jill Robbins test result is considered negative (normal).  This means that we have not identified a hereditary cause for her family history of cancer at this time.   An individual's cancer risk and medical management are not determined by genetic test results alone. Overall cancer risk assessment  incorporates additional factors, including personal medical history, family history, and any available genetic information that may result in a personalized plan for cancer prevention and surveillance. Therefore, it is recommended she continue to follow the cancer management and screening guidelines provided by her oncology and primary healthcare provider.  Based on the reported personal and family history, specific cancer screenings for Jill Robbins and her family include:   Breast Cancer Screening:  The Tyrer-Cuzick model is one of multiple prediction models developed to estimate an individual's lifetime risk of developing breast cancer. The Tyrer-Cuzick model is endorsed by the [employer]  (NCCN). This model includes many risk factors such as family history, endogenous estrogen exposure, and benign breast disease. The calculation is highly-dependent on the accuracy of clinical data provided by the patient and can change over time. The Tyrer-Cuzick model may be repeated to reflect new information in her personal or family history in the future.      Jill Robbins Tyrer-Cuzick risk score is 39.1%.  For women with a greater than 20% lifetime risk of breast cancer, the NCCN recommends the following:    1.   Clinical encounter every 6-12 months to begin when identified as being at increased risk, but not before age 78    2.   Annual mammograms, tomosynthesis is recommended starting 10 years earlier than the youngest breast cancer diagnosis in the family or at age 52 (whichever comes first), but not before age 74     3.   Annual breast MRI starting 10 years earlier than the youngest breast cancer diagnosis in the family or at age 67 (whichever comes first), but not before age 80   We have offered a referral for [facility]. Patient prefers to discuss with her gynecologist first.   [place]:   Since she did not inherit a  identifiable mutation in a cancer predisposition gene included on this panel, her children could not have inherited a known mutation from her in one of these genes. Individuals in this family might be at some increased risk of developing cancer, over the general population risk, due to the family history of cancer.  Individuals in the family should notify their providers of the family history of cancer. We recommend women in this family have a yearly mammogram beginning at age 28, or 28 years younger than the earliest onset of cancer, an annual clinical breast exam, and perform monthly breast self-exams.  Family members should have colonoscopies by at age 34, or earlier, as recommended by their providers.  FOLLOW-UP:  Lastly, we discussed with Ms. Zuk that cancer genetics is a rapidly advancing field and it is possible that new genetic tests will be appropriate for her and/or her family members in the future. We encouraged her to remain in contact with cancer genetics on an annual basis so we can update her personal and family histories and let her know of advances in cancer genetics that may benefit this family.   Our contact number was provided. Ms. Myrick questions were answered to her satisfaction, and  she knows she is welcome to call us at anytime with additional questions or concerns.    [name], MS, [facility] Genetic Counselor [email or web address] Phone: [phone]

## 2022-06-03 ENCOUNTER — Ambulatory Visit (INDEPENDENT_AMBULATORY_CARE_PROVIDER_SITE_OTHER): Admitting: Vascular Surgery

## 2022-06-03 ENCOUNTER — Encounter (INDEPENDENT_AMBULATORY_CARE_PROVIDER_SITE_OTHER): Payer: Self-pay | Admitting: Vascular Surgery

## 2022-06-03 VITALS — BP 109/71 | HR 65 | Resp 18 | Wt 143.2 lb

## 2022-06-03 DIAGNOSIS — I83811 Varicose veins of right lower extremities with pain: Secondary | ICD-10-CM

## 2022-06-03 DIAGNOSIS — I10 Essential (primary) hypertension: Secondary | ICD-10-CM

## 2022-06-03 DIAGNOSIS — I83813 Varicose veins of bilateral lower extremities with pain: Secondary | ICD-10-CM | POA: Diagnosis not present

## 2022-06-03 NOTE — Progress Notes (Signed)
Patient ID: Jill Robbins, female   DOB: March 23, 1961, 61 y.o.   MRN: [id number]  Chief Complaint  Patient presents with   Establish Care    Referred by Dr Rollene Rotunda    HPI Jill Robbins is a 61 y.o. female.  I am asked to see the patient by Tally Joe for evaluation of prominent and more uncomfortable varicosities of the right medial lower leg.  The patient presents with complaints of symptomatic varicosities of the right medial lower leg. The patient reports a long standing history of varicosities and they have become painful over time. There was no clear inciting event or causative factor that started the symptoms although the varicosities are in an area where she had a trauma about 10 years ago.  The right leg is more severly affected. The patient elevates the legs for relief. The pain is described as stinging and burning.  The area can be a little puffy and swollen locally but she does not notice significant whole leg swelling on either side.  She has had sclerotherapy treatments for spider veins several times over the years.  The patient has no previous history of deep venous thrombosis or superficial thrombophlebitis to their knowledge.     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)   Family history of adverse reaction to anesthesia    uncle conplications with surgery, then family was all tested   HTN (hypertension)    Osteopenia    Succinylcholine adverse reaction     Past Surgical History:  Procedure Laterality Date   adnoids     as a child   BREAST BIOPSY Right 12/08/2017   BREAST BIOPSY Left    BREAST LUMPECTOMY WITH RADIOACTIVE SEED LOCALIZATION Left 08/27/2020   Procedure: LEFT BREAST LUMPECTOMY WITH RADIOACTIVE SEED LOCALIZATION;  Surgeon: Erroll Luna, MD;  Location: [facility];   Service: General;  Laterality: Left;   ENDOMETRIAL ABLATION     LABIOPLASTY Bilateral 09/22/2017   Procedure: LABIAL reduction;  Surgeon: Tyson Dense, MD;  Location: [facility];  Service: Gynecology;  Laterality: Bilateral;   RHINOPLASTY  1995   sclero theray  09/21/2016   varicose viens   WISDOM TOOTH EXTRACTION      Family History  Problem Relation Age of Onset   Skin cancer Father    Parkinson's disease Father    Colon cancer Maternal Aunt        dx 53s, d. 63s      Social History   Tobacco Use   Smoking status: Never   Smokeless tobacco: Never  Vaping Use   Vaping Use: Never used  Substance Use Topics   Alcohol use: Yes    Comment: social   Drug use: No     Allergies  Allergen Reactions   Succinylcholine Other (See Comments)    Difficultly waking up   Sulfa Antibiotics Rash   Other     [place] - lips swelling, ulcers inside the mouth   Peanut-Containing Drug Products Itching    Current Outpatient Medications  Medication Sig Dispense Refill   busPIRone (BUSPAR) 7.5 MG tablet Take 1 tablet (7.5 mg total) by mouth 2 (two) times daily. 180 tablet 1   Calcium Carb-Cholecalciferol (CALCIUM-VITAMIN D) 500-200 MG-UNIT tablet Take 2 tablets by  mouth daily.      DOTTI 0.05 MG/24HR patch Place 1 patch onto the skin 2 (two) times a week.     GLUCOSAMINE-CHONDROITIN PO Take 2 tablets by mouth daily.     Multiple Vitamin (MULTIVITAMIN WITH MINERALS) TABS tablet Take 2 tablets by mouth daily.     Omega-3 Fatty Acids (FISH OIL PO) Take 2 capsules by mouth daily.     progesterone (PROMETRIUM) 100 MG capsule Take 100 mg by mouth every evening.      triamterene-hydrochlorothiazide (MAXZIDE-25) 37.5-25 MG tablet Take 1 tablet by mouth daily. 90 tablet 1   No current facility-administered medications for this visit.      REVIEW OF SYSTEMS (Negative unless checked)  Constitutional: [] Weight loss  [] Fever  [] Chills Cardiac: [] Chest pain   [] Chest pressure    [] Palpitations   [] Shortness of breath when laying flat   [] Shortness of breath at rest   [] Shortness of breath with exertion. Vascular:  [] Pain in legs with walking   [] Pain in legs at rest   [] Pain in legs when laying flat   [] Claudication   [] Pain in feet when walking  [] Pain in feet at rest  [] Pain in feet when laying flat   [] History of DVT   [] Phlebitis   [] Swelling in legs   [x] Varicose veins   [] Non-healing ulcers Pulmonary:   [] Uses home oxygen   [] Productive cough   [] Hemoptysis   [] Wheeze  [] COPD   [] Asthma Neurologic:  [] Dizziness  [] Blackouts   [] Seizures   [] History of stroke   [] History of TIA  [] Aphasia   [] Temporary blindness   [] Dysphagia   [] Weakness or numbness in arms   [] Weakness or numbness in legs Musculoskeletal:  [x] Arthritis   [] Joint swelling   [] Joint pain   [] Low back pain Hematologic:  [] Easy bruising  [] Easy bleeding   [] Hypercoagulable state   [] Anemic  [] Hepatitis Gastrointestinal:  [] Blood in stool   [] Vomiting blood  [] Gastroesophageal reflux/heartburn   [] Abdominal pain Genitourinary:  [] Chronic kidney disease   [] Difficult urination  [] Frequent urination  [] Burning with urination   [] Hematuria Skin:  [] Rashes   [] Ulcers   [] Wounds Psychological:  [x] History of anxiety   []  History of major depression.    Physical Exam BP 109/71 (BP Location: Right Arm)   Pulse 65   Resp 18   Wt 143 lb 3.2 oz (65 kg)   BMI 20.55 kg/m  Gen:  WD/WN, NAD.  Appears far younger than stated age Head: [place]/AT, No temporalis wasting.  Ear/Nose/Throat: Hearing grossly intact, dentition good Eyes: Sclera non-icteric. Conjunctiva clear Neck: Supple. Trachea midline Pulmonary:  Good air movement, no use of accessory muscles, respirations not labored.  Cardiac: RRR, No JVD Vascular: Varicosities scattered and measuring up to 1 mm in the right lower extremity with the most prominent sites being in the right medial calf        Varicosities scant and measuring less than 1 mm Vessel  Right Left  Radial Palpable Palpable                          PT Palpable Palpable  DP Palpable Palpable    Musculoskeletal: M/S 5/5 throughout.  No significant LE edema Neurologic: Sensation grossly intact in extremities.  Symmetrical.  Speech is fluent.  Psychiatric: Judgment intact, Mood & affect appropriate for pt's clinical situation. Dermatologic: No rashes or ulcers noted.  No cellulitis or open wounds.    Radiology No results found.  Labs  No results found for this or any previous visit (from the past 2160 hour(s)).  Assessment/Plan:  Essential hypertension blood pressure control important in reducing the progression of atherosclerotic disease. On appropriate oral medications.   Varicose veins of leg with pain, right    The patient has symptoms consistent with chronic venous insufficiency. We discussed the natural history and treatment options for venous disease. I recommended the regular use of 20 - 30 mm Hg compression stockings. I recommended leg elevation and anti-inflammatories as needed for pain. I have also recommended a complete venous duplex to assess the venous system for reflux or thrombotic issues. This can be done at the patient's convenience. I will see the patient after the duplex to assess the response to conservative management, and determine further treatment options.     Leotis Pain 06/03/2022, 10:00 AM   This note was created with Dragon medical transcription system.  Any errors from dictation are unintentional.

## 2022-06-03 NOTE — Assessment & Plan Note (Signed)
blood pressure control important in reducing the progression of atherosclerotic disease. On appropriate oral medications.  

## 2022-06-13 ENCOUNTER — Ambulatory Visit: Admitting: Family Medicine

## 2022-06-14 ENCOUNTER — Ambulatory Visit (INDEPENDENT_AMBULATORY_CARE_PROVIDER_SITE_OTHER): Admitting: Family Medicine

## 2022-06-14 ENCOUNTER — Encounter: Payer: Self-pay | Admitting: Family Medicine

## 2022-06-14 VITALS — BP 111/75 | HR 72 | Temp 97.8°F | Resp 16 | Wt 143.9 lb

## 2022-06-14 DIAGNOSIS — I10 Essential (primary) hypertension: Secondary | ICD-10-CM | POA: Diagnosis not present

## 2022-06-14 DIAGNOSIS — F419 Anxiety disorder, unspecified: Secondary | ICD-10-CM | POA: Diagnosis not present

## 2022-06-14 MED ORDER — BUSPIRONE HCL 7.5 MG PO TABS: 7.5000 mg | ORAL_TABLET | Freq: Two times a day (BID) | ORAL | 1 refills | Status: DC

## 2022-06-14 MED ORDER — TRIAMTERENE-HCTZ 37.5-25 MG PO TABS: 1.0000 | ORAL_TABLET | Freq: Every day | ORAL | 1 refills | Status: DC

## 2022-06-14 NOTE — Progress Notes (Signed)
    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     06/14/2022   10:22 AM 06/29/2021    8:33 AM 03/08/2019   11:56 AM 07/13/2018   12:09 PM  GAD 7 : Generalized Anxiety Score  Nervous, Anxious, on Edge 1 0 3 3  Control/stop worrying 3 0 3 3  Worry too much - different things 3 0 3 3  Trouble relaxing 1 0 1 3  Restless 0 1 0 3  Easily annoyed or irritable 0 0 1 3  Afraid - awful might happen 0 0 2 3  Total GAD 7 Score 8 1 13 21   Anxiety Difficulty Not difficult at all Not difficult at all Somewhat difficult Very difficult     OBJECTIVE:   BP 111/75 (BP Location: Left Arm, Patient Position: Sitting, Cuff Size: Normal)   Pulse 72   Temp 97.8 F (36.6 C) (Oral)   Resp 16   Wt 143 lb 14.4 oz (65.3 kg)   BMI 20.65 kg/m   Gen: well appearing, in NAD Card: RRR Lungs: CTAB Ext: WWP, no edema   ASSESSMENT/PLAN:   Essential hypertension At goal. No changes. Obtaining labs.   Anxiety Improved with buspar. Has good coping mechanisms. No changes today.   F/u 6 months for CPE.  Myles Gip, DO

## 2022-06-14 NOTE — Assessment & Plan Note (Signed)
At goal. No changes. Obtaining labs. °

## 2022-06-14 NOTE — Assessment & Plan Note (Signed)
Improved with buspar. Has good coping mechanisms. No changes today.

## 2022-06-15 LAB — COMPREHENSIVE METABOLIC PANEL
ALT: 15 IU/L (ref 0–32)
AST: 19 IU/L (ref 0–40)
Albumin/Globulin Ratio: 2 (ref 1.2–2.2)
Albumin: 4.7 g/dL (ref 3.8–4.9)
Alkaline Phosphatase: 60 IU/L (ref 44–121)
BUN/Creatinine Ratio: 17 (ref 9–23)
BUN: 15 mg/dL (ref 6–24)
Bilirubin Total: 0.8 mg/dL (ref 0.0–1.2)
CO2: 25 mmol/L (ref 20–29)
Calcium: 9.8 mg/dL (ref 8.7–10.2)
Chloride: 101 mmol/L (ref 96–106)
Creatinine, Ser: 0.88 mg/dL (ref 0.57–1.00)
Globulin, Total: 2.3 g/dL (ref 1.5–4.5)
Glucose: 92 mg/dL (ref 70–99)
Potassium: 4 mmol/L (ref 3.5–5.2)
Sodium: 143 mmol/L (ref 134–144)
Total Protein: 7 g/dL (ref 6.0–8.5)
eGFR: 76 mL/min/{1.73_m2} (ref >59)

## 2022-06-22 ENCOUNTER — Ambulatory Visit
Admission: RE | Admit: 2022-06-22 | Discharge: 2022-06-22 | Disposition: A | Discharge status: Home / Self Care | Source: Ambulatory Visit | Attending: Obstetrics and Gynecology | Admitting: Obstetrics and Gynecology

## 2022-06-22 ENCOUNTER — Other Ambulatory Visit (INDEPENDENT_AMBULATORY_CARE_PROVIDER_SITE_OTHER): Payer: Self-pay | Admitting: Vascular Surgery

## 2022-06-22 DIAGNOSIS — I83811 Varicose veins of right lower extremities with pain: Secondary | ICD-10-CM

## 2022-06-22 DIAGNOSIS — Z1231 Encounter for screening mammogram for malignant neoplasm of breast: Secondary | ICD-10-CM

## 2022-06-23 ENCOUNTER — Ambulatory Visit (INDEPENDENT_AMBULATORY_CARE_PROVIDER_SITE_OTHER)

## 2022-06-23 ENCOUNTER — Ambulatory Visit (INDEPENDENT_AMBULATORY_CARE_PROVIDER_SITE_OTHER): Admitting: Nurse Practitioner

## 2022-06-23 ENCOUNTER — Encounter (INDEPENDENT_AMBULATORY_CARE_PROVIDER_SITE_OTHER): Payer: Self-pay | Admitting: Nurse Practitioner

## 2022-06-23 DIAGNOSIS — I83811 Varicose veins of right lower extremities with pain: Secondary | ICD-10-CM | POA: Diagnosis not present

## 2022-06-26 ENCOUNTER — Encounter (INDEPENDENT_AMBULATORY_CARE_PROVIDER_SITE_OTHER): Payer: Self-pay | Admitting: Nurse Practitioner

## 2022-06-26 NOTE — Progress Notes (Signed)
Subjective:    Patient ID: Jill Robbins, female    DOB: April 16, 1961, 61 y.o.   MRN: [id number] No chief complaint on file.   Jill Robbins is a 61 y.o. female.  The patient returns today for follow-up evaluation of prominent and uncomfortable varicose veins in the right lower extremity.  The patient presents with complaints of symptomatic varicosities of the right medial lower leg. The patient reports a long standing history of varicosities and they have become painful over time.There was no clear inciting event or causative factor that started the symptoms although the varicosities are in an area where she had a trauma about 10 years ago.  The right leg is more severly affected. The patient elevates the legs for relief. The pain is described as stinging and burning.  The area can be a little puffy and swollen locally but she does not notice significant whole leg swelling on either side.  She has had sclerotherapy treatments for spider veins several times over the years.  The patient has no previous history of deep venous thrombosis or superficial thrombophlebitis to their knowledge.  Noninvasive studies show no evidence of deep venous insufficiency.  No evidence of DVT or superficial thrombophlebitis in the right lower extremity.  No evidence of superficial venous reflux on the right side.        Review of Systems  Cardiovascular:  Positive for leg swelling.  All other systems reviewed and are negative.      Objective:   Physical Exam Vitals reviewed.  HENT:     Head: Normocephalic.  Cardiovascular:     Rate and Rhythm: Normal rate.     Pulses: Normal pulses.  Pulmonary:     Effort: Pulmonary effort is normal.  Skin:    General: Skin is warm and dry.  Neurological:     Mental Status: She is alert and oriented to person, place, and time.  Psychiatric:        Mood and Affect: Mood normal.        Behavior: Behavior normal.        Thought Content: Thought content normal.         Judgment: Judgment normal.     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)   Family history of adverse reaction to anesthesia    uncle conplications with surgery, then family was all tested   HTN (hypertension)    Osteopenia    Succinylcholine adverse reaction     Social History   Socioeconomic History   Marital status: Married    Spouse name: Not on file   Number of children: Not on file   Years of education: Not on file   Highest education level: Not on file  Occupational History   Not on file  Tobacco Use   Smoking status: Never   Smokeless tobacco: Never  Vaping Use   Vaping Use: Never used  Substance and Sexual Activity   Alcohol use: Yes    Comment: social   Drug use: No   Sexual activity: Yes  Birth control/protection: Surgical    Comment: husband has had vasectomy and pt ablation  Other Topics Concern   Not on file  Social History Narrative   Not on file   Social Determinants of Health   Financial Resource Strain: Not on file  Food Insecurity: Not on file  Transportation Needs: Not on file  Physical Activity: Not on file  Stress: Not on file  Social Connections: Not on file  Intimate Partner Violence: Not on file    Past Surgical History:  Procedure Laterality Date   adnoids     as a child   BREAST BIOPSY Right 12/08/2017   BREAST BIOPSY Left    BREAST LUMPECTOMY WITH RADIOACTIVE SEED LOCALIZATION Left 08/27/2020   Procedure: LEFT BREAST LUMPECTOMY WITH RADIOACTIVE SEED LOCALIZATION;  Surgeon: Harriette Bouillon, MD;  Location: [facility];  Service: General;  Laterality: Left;   ENDOMETRIAL ABLATION     LABIOPLASTY Bilateral  09/22/2017   Procedure: LABIAL reduction;  Surgeon: Ranae Pila, MD;  Location: [facility];  Service: Gynecology;  Laterality: Bilateral;   RHINOPLASTY  1995   sclero theray  09/21/2016   varicose viens   WISDOM TOOTH EXTRACTION      Family History  Problem Relation Age of Onset   Skin cancer Father    Parkinson's disease Father    Colon cancer Maternal Aunt        dx 65s, d. 27s    Allergies  Allergen Reactions   Succinylcholine Other (See Comments)    Difficultly waking up   Sulfa Antibiotics Rash   Other     Mellon - lips swelling, ulcers inside the mouth   Peanut-Containing Drug Products Itching       Latest Ref Rng & Units 11/04/2021    8:46 AM 10/23/2020    9:53 AM 08/24/2020    8:30 AM  CBC  WBC 3.4 - 10.8 x10E3/uL 5.2  4.9  5.9   Hemoglobin 11.1 - 15.9 g/dL 35.7  01.7  79.3   Hematocrit 34.0 - 46.6 % 40.6  43.6  43.3   Platelets 150 - 450 x10E3/uL 262  260  256       CMP     Component Value Date/Time   Robbins 143 06/14/2022 1043   K 4.0 06/14/2022 1043   CL 101 06/14/2022 1043   CO2 25 06/14/2022 1043   GLUCOSE 92 06/14/2022 1043   GLUCOSE 84 08/24/2020 0830   BUN 15 06/14/2022 1043   CREATININE 0.88 06/14/2022 1043   CREATININE 0.73 06/23/2017 0912   CALCIUM 9.8 06/14/2022 1043   PROT 7.0 06/14/2022 1043   ALBUMIN 4.7 06/14/2022 1043   AST 19 06/14/2022 1043   ALT 15 06/14/2022 1043   ALKPHOS 60 06/14/2022 1043   BILITOT 0.8 06/14/2022 1043   GFRNONAA 76 10/23/2020 0953   GFRNONAA >60 08/24/2020 0830   GFRNONAA 93 06/23/2017 0912   GFRAA 88 10/23/2020 0953   GFRAA 108 06/23/2017 0912     No results found.     Assessment & Plan:   1. Varicose veins of leg with pain, right Today noninvasive study showed no evidence of significant venous reflux in the right lower extremity as well as no evidence of DVT.  I suspect that the pain and swelling may be as a result of the trauma she experienced years ago.  At this time we will not move forward  with any further intervention.  Patient will contact us if there is further worsening of  varicosities and we can reevaluate at that time.  We will follow-up on an as-needed basis. - VAS [place] LOWER EXTREMITY VENOUS REFLUX   Current Outpatient Medications on File Prior to Visit  Medication Sig Dispense Refill   busPIRone (BUSPAR) 7.5 MG tablet Take 1 tablet (7.5 mg total) by mouth 2 (two) times daily. 180 tablet 1   Calcium Carb-Cholecalciferol (CALCIUM-VITAMIN D) 500-200 MG-UNIT tablet Take 2 tablets by mouth daily.      GLUCOSAMINE-CHONDROITIN PO Take 2 tablets by mouth daily.     levocetirizine (XYZAL) 5 MG tablet Take 10 mg by mouth every evening.     Multiple Vitamin (MULTIVITAMIN WITH MINERALS) TABS tablet Take 2 tablets by mouth daily.     Omega-3 Fatty Acids (FISH OIL PO) Take 2 capsules by mouth daily.     progesterone (PROMETRIUM) 100 MG capsule Take 100 mg by mouth every evening.      triamterene-hydrochlorothiazide (MAXZIDE-25) 37.5-25 MG tablet Take 1 tablet by mouth daily. 90 tablet 1   DOTTI 0.05 MG/24HR patch Place 1 patch onto the skin 2 (two) times a week.     No current facility-administered medications on file prior to visit.    There are no Patient Instructions on file for this visit. No follow-ups on file.   Kris Hartmann, NP

## 2022-07-11 ENCOUNTER — Encounter (INDEPENDENT_AMBULATORY_CARE_PROVIDER_SITE_OTHER): Payer: Self-pay

## 2022-09-28 ENCOUNTER — Other Ambulatory Visit: Payer: Self-pay | Admitting: Family Medicine

## 2022-09-28 DIAGNOSIS — I10 Essential (primary) hypertension: Secondary | ICD-10-CM

## 2022-09-28 DIAGNOSIS — F419 Anxiety disorder, unspecified: Secondary | ICD-10-CM

## 2022-09-28 NOTE — Telephone Encounter (Signed)
Requested Prescriptions  Pending Prescriptions Disp Refills   busPIRone (BUSPAR) 7.5 MG tablet [Pharmacy Med Name: BUSPIRONE HCL 7.5 MG TAB] 180 tablet 0    Sig: TAKE ONE (1) TABLET BY MOUTH TWO TIMES PER DAY     Psychiatry: Anxiolytics/Hypnotics - Non-controlled Passed - 09/28/2022  2:10 PM      Passed - Valid encounter within last 12 months    Recent Outpatient Visits           3 months ago Essential hypertension   [facility] Myles Gip, DO   6 months ago Acute frontal sinusitis, recurrence not specified   [facility] Myles Gip, DO   7 months ago Acute non-recurrent maxillary sinusitis   [facility] [place], Dionne Bucy, MD   11 months ago Encounter for annual physical exam   [facility] [place], Dionne Bucy, MD   1 year ago [place] Jerrol Banana., MD       Future Appointments             In 1 month Bacigalupo, Dionne Bucy, MD [facility], [place]   In 1 month Jaquita Folds, MD Urogynecology at [facility], [facility]             triamterene-hydrochlorothiazide (MAXZIDE-25) 37.5-25 MG tablet [Pharmacy Med Name: TRIAMTERENE-HCTZ 37.5-25 MG TAB] 90 tablet 0    Sig: TAKE 1 TABLET BY MOUTH DAILY     Cardiovascular: Diuretic Combos Passed - 09/28/2022  2:10 PM      Passed - K in normal range and within 180 days    Potassium  Date Value Ref Range Status  06/14/2022 4.0 3.5 - 5.2 mmol/L Final         Passed - Na in normal range and within 180 days    Sodium  Date Value Ref Range Status  06/14/2022 143 134 - 144 mmol/L Final         Passed - Cr in normal range and within 180 days    Creat  Date Value Ref Range Status  06/23/2017 0.73 0.50 - 1.05 mg/dL Final    Comment:    For patients >100 years of age, the reference limit for Creatinine is approximately 13% higher for people identified as African-American. .     Creatinine, Ser  Date Value Ref Range Status  06/14/2022 0.88 0.57 - 1.00 mg/dL Final         Passed - Last BP in normal range    BP Readings from Last 1 Encounters:  06/23/22 133/80         Passed - Valid encounter within last 6 months    Recent Outpatient Visits           3 months ago Essential hypertension   [facility] Myles Gip, DO   6 months ago Acute frontal sinusitis, recurrence not specified   [facility] [place], DO   7 months ago Acute non-recurrent maxillary sinusitis   [facility] [place], Dionne Bucy, MD   11 months ago Encounter for annual physical exam   [facility], Dionne Bucy, MD   1 year ago [place] Jerrol Banana., MD       Future Appointments             In 1 month Bacigalupo, Dionne Bucy, MD [facility], [place]   In  1 month Jaquita Folds, MD Urogynecology at [facility], [facility]

## 2022-11-07 ENCOUNTER — Ambulatory Visit (INDEPENDENT_AMBULATORY_CARE_PROVIDER_SITE_OTHER): Admitting: Family Medicine

## 2022-11-07 ENCOUNTER — Encounter: Payer: Self-pay | Admitting: Family Medicine

## 2022-11-07 ENCOUNTER — Telehealth: Payer: Self-pay

## 2022-11-07 VITALS — BP 125/84 | HR 76 | Temp 98.0°F | Resp 16 | Ht 70.0 in | Wt 144.0 lb

## 2022-11-07 DIAGNOSIS — F419 Anxiety disorder, unspecified: Secondary | ICD-10-CM

## 2022-11-07 DIAGNOSIS — N951 Menopausal and female climacteric states: Secondary | ICD-10-CM

## 2022-11-07 DIAGNOSIS — Z0000 Encounter for general adult medical examination without abnormal findings: Secondary | ICD-10-CM

## 2022-11-07 DIAGNOSIS — I10 Essential (primary) hypertension: Secondary | ICD-10-CM

## 2022-11-07 MED ORDER — TRIAMTERENE-HCTZ 37.5-25 MG PO TABS: 1.0000 | ORAL_TABLET | Freq: Every day | ORAL | 1 refills | Status: DC

## 2022-11-07 MED ORDER — BUSPIRONE HCL 7.5 MG PO TABS: 7.5000 mg | ORAL_TABLET | Freq: Two times a day (BID) | ORAL | 3 refills | Status: DC

## 2022-11-07 MED ORDER — GABAPENTIN 100 MG PO CAPS: 100.0000 mg | ORAL_CAPSULE | Freq: Every day | ORAL | 1 refills | Status: DC

## 2022-11-07 MED ORDER — CETIRIZINE HCL 10 MG PO TABS: 10.0000 mg | ORAL_TABLET | Freq: Every day | ORAL | 3 refills | Status: DC

## 2022-11-07 NOTE — Addendum Note (Signed)
Addended by: Virginia Crews on: 11/07/2022 01:02 PM   Modules accepted: Orders

## 2022-11-07 NOTE — Telephone Encounter (Signed)
[facility] pharmacy called, spoke with Judeen Hammans, [facility] regarding the buspar 7.'5mg'$  #180/3 RF. She states that they did receive the medication and also the other ones that were sent in today as well and all were put on hold d/t recent fill on 09/28/22 for 90 DS. She states that these will be on hold and when due for next fill will be active. No further assistance needed. Will close out this encounter.

## 2022-11-07 NOTE — Progress Notes (Signed)
I,Sulibeya S Dimas,acting as a Education administrator for Jill Paganini, MD.,have documented all relevant documentation on the behalf of Jill Paganini, MD,as directed by  Jill Paganini, MD while in the presence of Jill Paganini, MD.    Complete physical exam   Patient: Jill Robbins   DOB: Jan 02, 1961   62 y.o. Female  MRN: [id number] Visit Date: 11/07/2022  Today's healthcare provider: Lavon Paganini, MD   Chief Complaint  Patient presents with   Annual Exam   Subjective    Jill Robbins is a 62 y.o. female who presents today for a complete physical exam.  She reports consuming a general diet. Home exercise routine includes walking. She generally feels well. She reports sleeping poorly. She does not have additional problems to discuss today.  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)   Family history of adverse reaction to anesthesia    uncle conplications with surgery, then family was all tested   HTN (hypertension)    Osteopenia    Succinylcholine adverse reaction    Past Surgical History:  Procedure Laterality Date   adnoids     as a child   BREAST BIOPSY Right 12/08/2017   BREAST BIOPSY Left    BREAST LUMPECTOMY WITH RADIOACTIVE SEED LOCALIZATION Left 08/27/2020   Procedure: LEFT BREAST LUMPECTOMY WITH RADIOACTIVE SEED LOCALIZATION;  Surgeon: Erroll Luna, MD;  Location: [place];  Service: General;  Laterality: Left;   ENDOMETRIAL ABLATION     LABIOPLASTY Bilateral 09/22/2017   Procedure: LABIAL reduction;  Surgeon: Tyson Dense, MD;  Location: [facility];  Service: Gynecology;  Laterality: Bilateral;   RHINOPLASTY  1995   sclero theray  09/21/2016   varicose viens   WISDOM TOOTH EXTRACTION     Social History   Socioeconomic History   Marital status:  Married    Spouse name: Not on file   Number of children: Not on file   Years of education: Not on file   Highest education level: Not on file  Occupational History   Not on file  Tobacco Use   Smoking status: Never   Smokeless tobacco: Never  Vaping Use   Vaping Use: Never used  Substance and Sexual Activity   Alcohol use: Yes    Comment: social   Drug use: No   Sexual activity: Yes    Birth control/protection: Surgical    Comment: husband has had vasectomy and pt ablation  Other Topics Concern   Not on file  Social History Narrative   Not on file   Social Determinants of Health   Financial Resource Strain: Not on file  Food Insecurity: Not on file  Transportation Needs: Not on file  Physical Activity: Not on file  Stress: Not on file  Social Connections: Not on file  Intimate Partner Violence: Not on file   Family Status  Relation Name Status   Mother  Alive   Father  Deceased   Sister  Alive   Brother  Alive   Mat Aunt  Deceased   Family History  Problem Relation Age of Onset   Skin cancer Father    Parkinson's disease Father    Colon cancer Maternal Aunt        dx 50s, d. 11s  Allergies  Allergen Reactions   Succinylcholine Other (See Comments)    Difficultly waking up   Sulfa Antibiotics Rash   Other     Mellon - lips swelling, ulcers inside the mouth   Peanut-Containing Drug Products Itching    Patient Care Team: Virginia Crews, MD as PCP - General (Family Medicine)   Medications: Outpatient Medications Prior to Visit  Medication Sig   Calcium Carb-Cholecalciferol (CALCIUM-VITAMIN D) 500-200 MG-UNIT tablet Take 2 tablets by mouth daily.    GLUCOSAMINE-CHONDROITIN PO Take 2 tablets by mouth daily.   Multiple Vitamin (MULTIVITAMIN WITH MINERALS) TABS tablet Take 2 tablets by mouth daily.   Omega-3 Fatty Acids (FISH OIL PO) Take 2 capsules by mouth daily.   [DISCONTINUED] busPIRone (BUSPAR) 7.5 MG tablet TAKE ONE (1) TABLET BY MOUTH TWO  TIMES PER DAY   [DISCONTINUED] triamterene-hydrochlorothiazide (MAXZIDE-25) 37.5-25 MG tablet TAKE 1 TABLET BY MOUTH DAILY   [DISCONTINUED] DOTTI 0.05 MG/24HR patch Place 1 patch onto the skin 2 (two) times a week.   [DISCONTINUED] levocetirizine (XYZAL) 5 MG tablet Take 10 mg by mouth every evening. (Patient not taking: Reported on 11/07/2022)   No facility-administered medications prior to visit.    Review of Systems  Constitutional:  Positive for chills, diaphoresis and unexpected weight change.  HENT:  Positive for congestion and rhinorrhea.   Endocrine: Positive for cold intolerance and heat intolerance.  Allergic/Immunologic: Positive for environmental allergies.  Psychiatric/Behavioral:  The patient is nervous/anxious and is hyperactive.   All other systems reviewed and are negative.     Objective    BP 125/84 (BP Location: Left Arm, Patient Position: Sitting, Cuff Size: Normal)   Pulse 76   Temp 98 F (36.7 C) (Temporal)   Resp 16   Ht '5\' 10"'$  (1.778 m)   Wt 144 lb (65.3 kg)   BMI 20.66 kg/m     Physical Exam Vitals reviewed.  Constitutional:      General: She is not in acute distress.    Appearance: Normal appearance. She is well-developed. She is not diaphoretic.  HENT:     Head: Normocephalic and atraumatic.     Right Ear: Tympanic membrane, ear canal and external ear normal.     Left Ear: Tympanic membrane, ear canal and external ear normal.     Nose: Nose normal.     Mouth/Throat:     Mouth: Mucous membranes are moist.     Pharynx: Oropharynx is clear. No oropharyngeal exudate.  Eyes:     General: No scleral icterus.    Conjunctiva/sclera: Conjunctivae normal.     Pupils: Pupils are equal, round, and reactive to light.  Neck:     Thyroid: No thyromegaly.  Cardiovascular:     Rate and Rhythm: Normal rate and regular rhythm.     Pulses: Normal pulses.     Heart sounds: Normal heart sounds. No murmur heard. Pulmonary:     Effort: Pulmonary effort is  normal. No respiratory distress.     Breath sounds: Normal breath sounds. No wheezing or rales.  Abdominal:     General: There is no distension.     Palpations: Abdomen is soft.     Tenderness: There is no abdominal tenderness.  Musculoskeletal:        General: No deformity.     Cervical back: Neck supple.     Right lower leg: No edema.     Left lower leg: No edema.  Lymphadenopathy:     Cervical: No cervical adenopathy.  Skin:    General: Skin is warm and dry.     Findings: No rash.  Neurological:     Mental Status: She is alert and oriented to person, place, and time. Mental status is at baseline.     Sensory: No sensory deficit.     Motor: No weakness.     Gait: Gait normal.  Psychiatric:        Mood and Affect: Mood normal.        Behavior: Behavior normal.        Thought Content: Thought content normal.       Last depression screening scores    11/07/2022   10:41 AM 06/14/2022   10:29 AM 03/08/2022    1:13 PM  PHQ 2/9 Scores  PHQ - 2 Score 1 0 0  PHQ- 9 Score '5 2 2   '$ Last fall risk screening    11/07/2022   10:41 AM  Fall Risk   Falls in the past year? 1  Number falls in past yr: 0  Injury with Fall? 1  Risk for fall due to : History of fall(s)  Follow up Falls evaluation completed   Last Audit-C alcohol use screening    11/07/2022   10:41 AM  Alcohol Use Disorder Test (AUDIT)  1. How often do you have a drink containing alcohol? 1  2. How many drinks containing alcohol do you have on a typical day when you are drinking? 0  3. How often do you have six or more drinks on one occasion? 0  AUDIT-C Score 1   A score of 3 or more in women, and 4 or more in men indicates increased risk for alcohol abuse, EXCEPT if all of the points are from question 1   No results found for any visits on 11/07/22.  Assessment & Plan    Routine Health Maintenance and Physical Exam  Exercise Activities and Dietary recommendations  Goals   None     Immunization History   Administered Date(s) Administered   Influenza,inj,Quad PF,6+ Mos 07/09/2014   Influenza-Unspecified 06/29/2018   PFIZER(Purple Top)SARS-COV-2 Vaccination 11/07/2019, 12/05/2019   Pfizer Covid-19 Vaccine Bivalent Booster 31yr & up 09/03/2020   Tdap 06/21/2013   Zoster Recombinat (Shingrix) 08/25/2017, 11/24/2017    Health Maintenance  Topic Date Due   COVID-19 Vaccine (4 - 2023-24 season) 05/13/2022   INFLUENZA VACCINE  12/11/2022 (Originally 04/12/2022)   PAP SMEAR-Modifier  05/16/2023   DTaP/Tdap/Td (2 - Td or Tdap) 06/22/2023   MAMMOGRAM  06/22/2024   COLONOSCOPY (Pts [id number] coverage will need to be confirmed)  08/01/2024   Hepatitis C Screening  Completed   HIV Screening  Completed   Zoster Vaccines- Shingrix  Completed   HPV VACCINES  Aged Out    Discussed health benefits of physical activity, and encouraged her to engage in regular exercise appropriate for her age and condition.  Problem List Items Addressed This Visit       Cardiovascular and Mediastinum   Essential hypertension    Well controlled Continue current medications Recheck metabolic panel F/u in 6 months       Relevant Medications   triamterene-hydrochlorothiazide (MAXZIDE-25) 37.5-25 MG tablet   Other Relevant Orders   Comprehensive metabolic panel   Lipid panel   Hot flashes due to menopause    Longstanding issue, uncontrolled No longer on HRT due to pre-cancerous changes in her breast Discussed options including SSRI, gabapentin, and clonidine Patient opts for gabapentin at this time Will start  low dose qhs I Can titrate as needed for symptoms in the future      Relevant Medications   triamterene-hydrochlorothiazide (MAXZIDE-25) 37.5-25 MG tablet     Other   Anxiety    Chronic and well controlled Continue buspar at current dose      Relevant Medications   busPIRone (BUSPAR) 7.5 MG tablet   Other Visit Diagnoses     Encounter for annual physical exam    -  Primary    Relevant Orders   Comprehensive metabolic panel   Lipid panel   CBC   TSH        Return in about 6 months (around 05/08/2023) for chronic disease f/u.     I, Jill Paganini, MD, have reviewed all documentation for this visit. The documentation on 11/07/22 for the exam, diagnosis, procedures, and orders are all accurate and complete.   Jaquail Mclees, Dionne Bucy, MD, MPH [facility]

## 2022-11-07 NOTE — Assessment & Plan Note (Signed)
Longstanding issue, uncontrolled No longer on HRT due to pre-cancerous changes in her breast Discussed options including SSRI, gabapentin, and clonidine Patient opts for gabapentin at this time Will start low dose qhs I Can titrate as needed for symptoms in the future

## 2022-11-07 NOTE — Addendum Note (Signed)
Addended by: Erie Noe on: 11/07/2022 12:00 PM   Modules accepted: Orders

## 2022-11-07 NOTE — Assessment & Plan Note (Signed)
Well controlled Continue current medications Recheck metabolic panel F/u in 6 months  

## 2022-11-07 NOTE — Assessment & Plan Note (Signed)
Chronic and well controlled Continue buspar at current dose

## 2022-11-08 ENCOUNTER — Ambulatory Visit: Admitting: Obstetrics and Gynecology

## 2022-11-08 ENCOUNTER — Encounter: Payer: Self-pay | Admitting: Obstetrics and Gynecology

## 2022-11-08 VITALS — BP 123/83 | HR 79 | Ht 70.08 in | Wt 141.6 lb

## 2022-11-08 DIAGNOSIS — N3281 Overactive bladder: Secondary | ICD-10-CM

## 2022-11-08 DIAGNOSIS — R350 Frequency of micturition: Secondary | ICD-10-CM | POA: Diagnosis not present

## 2022-11-08 DIAGNOSIS — N393 Stress incontinence (female) (male): Secondary | ICD-10-CM

## 2022-11-08 LAB — POCT URINALYSIS DIPSTICK
Bilirubin, UA: NEGATIVE
Blood, UA: NEGATIVE
Glucose, UA: NEGATIVE
Ketones, UA: NEGATIVE
Nitrite, UA: NEGATIVE
Protein, UA: NEGATIVE
Spec Grav, UA: 1.02 (ref 1.010–1.025)
Urobilinogen, UA: 0.2 E.U./dL
pH, UA: 7.5 (ref 5.0–8.0)

## 2022-11-08 MED ORDER — CIPROFLOXACIN HCL 500 MG PO TABS: 500.0000 mg | ORAL_TABLET | Freq: Two times a day (BID) | ORAL | 0 refills | Status: AC

## 2022-11-08 NOTE — Progress Notes (Signed)
[facility] New Patient Evaluation and Consultation  Referring Provider: Tyson Dense, * PCP: Virginia Crews, MD Date of Service: 11/08/2022  SUBJECTIVE Chief Complaint: New Patient (Initial Visit) Jill Robbins is a 62 y.o. female is here for urinary incontinence. )  History of Present Illness: Jill Robbins is a 62 y.o. White or Caucasian female seen in consultation at the request of Dr. Royston Sinner for evaluation of incontinence.    Review of records from Dr Royston Sinner significant for: Has urinary frequency and incontinence that did not improve with pelvic PT  Urinary Symptoms: Leaks urine with cough/ sneeze, laughing, exercise, lifting, with a full bladder, with movement to the bathroom, with urgency, and without sensation. SUI > UUI Unsure how often she is leaking- pad is wet throughout the day, more with walking/ exercise Pad use: 2 pads per day.   She is bothered by her UI symptoms.  Day time voids 8+.  Nocturia: 0 times per night to void. Voiding dysfunction: she empties her bladder well.  does not use a catheter to empty bladder.  When urinating, she feels dribbling after finishing and the need to urinate multiple times in a row Drinks: 2 cups coffee, 4-5 glasses water per day  UTIs:  0  UTI's in the last year.   Reports history of kidney or bladder stones  Pelvic Organ Prolapse Symptoms:                  She Denies a feeling of a bulge the vaginal area.   Bowel Symptom: Bowel movements: every other day Stool consistency: soft  Straining: no.  Splinting: no.  Incomplete evacuation: no.  She Denies accidental bowel leakage / fecal incontinence Bowel regimen: none   Sexual Function Sexually active: no.  Sexual orientation:  heterosexual Pain with sex: No  Pelvic Pain Denies pelvic pain   Past Medical History:  Past Medical History:  Diagnosis Date   Anxiety    Arthritis    fingers   Atypical hyperplasia of left breast    Complication of  anesthesia    will not wake up after succinylcholine (blood test done  genetic deficienty)   Family history of adverse reaction to anesthesia    uncle conplications with surgery, then family was all tested   HTN (hypertension)    Osteopenia    Succinylcholine adverse reaction      Past Surgical History:   Past Surgical History:  Procedure Laterality Date   adnoids     as a child   BREAST BIOPSY Right 12/08/2017   BREAST BIOPSY Left    BREAST LUMPECTOMY WITH RADIOACTIVE SEED LOCALIZATION Left 08/27/2020   Procedure: LEFT BREAST LUMPECTOMY WITH RADIOACTIVE SEED LOCALIZATION;  Surgeon: Erroll Luna, MD;  Location: [facility];  Service: General;  Laterality: Left;   ENDOMETRIAL ABLATION     LABIOPLASTY Bilateral 09/22/2017   Procedure: LABIAL reduction;  Surgeon: Tyson Dense, MD;  Location: [facility];  Service: Gynecology;  Laterality: Bilateral;   RHINOPLASTY  1995   sclero theray  09/21/2016   varicose viens   WISDOM TOOTH EXTRACTION       Past OB/GYN History: OB History  Gravida Para Term Preterm AB Living  0 0 0 0 0 0  SAB IAB Ectopic Multiple Live Births  0 0 0 0 0    Menopausal: had ablation 10 years ago Last pap smear was last year- negative.    Medications: She has a current medication list which  includes the following prescription(s): buspirone, calcium-vitamin d, cetirizine, ciprofloxacin, glucosamine-chondroitin, multivitamin with minerals, omega-3 fatty acids, miebo, triamterene-hydrochlorothiazide, and gabapentin.   Allergies: Patient is allergic to succinylcholine, sulfa antibiotics, other, and peanut-containing drug products.   Social History:  Social History   Tobacco Use   Smoking status: Never   Smokeless tobacco: Never  Vaping Use   Vaping Use: Never used  Substance Use Topics   Alcohol use: Yes    Comment: social   Drug use: No    Relationship status: married She lives with husband.   She is not employed. Regular  exercise: Yes: walking History of abuse: No  Family History:   Family History  Problem Relation Age of Onset   Diabetes Mother    Deep vein thrombosis Mother    Skin cancer Father    Parkinson's disease Father    Colon cancer Maternal Aunt        dx 61s, d. 64s   Skin cancer Paternal Grandmother    Emphysema Paternal Grandmother      Review of Systems: Review of Systems  Constitutional:  Negative for fever, malaise/fatigue and weight loss.  Respiratory:  Negative for cough, shortness of breath and wheezing.   Cardiovascular:  Negative for chest pain, palpitations and leg swelling.  Gastrointestinal:  Negative for abdominal pain and blood in stool.  Genitourinary:  Negative for dysuria.  Musculoskeletal:  Negative for myalgias.  Skin:  Negative for rash.  Neurological:  Positive for headaches. Negative for dizziness.  Endo/Heme/Allergies:  Does not bruise/bleed easily.       + hot flashes  Psychiatric/Behavioral:  Negative for depression. The patient is nervous/anxious.      OBJECTIVE Physical Exam: Vitals:   11/08/22 1047  BP: 123/83  Pulse: 79  Weight: 141 lb 9.6 oz (64.2 kg)  Height: 5' 10.08" (1.78 m)    Physical Exam Constitutional:      General: She is not in acute distress. Pulmonary:     Effort: Pulmonary effort is normal.  Abdominal:     General: There is no distension.     Palpations: Abdomen is soft.     Tenderness: There is no abdominal tenderness. There is no rebound.  Musculoskeletal:        General: No swelling. Normal range of motion.  Skin:    General: Skin is warm and dry.     Findings: No rash.  Neurological:     Mental Status: She is alert and oriented to person, place, and time.  Psychiatric:        Mood and Affect: Mood normal.        Behavior: Behavior normal.      GU / Detailed Urogynecologic Evaluation:  Pelvic Exam: Normal external female genitalia; Bartholin's and Skene's glands normal in appearance; urethral meatus normal in  appearance, no urethral masses or discharge.   CST: negative  Speculum exam reveals normal vaginal mucosa with atrophy. Cervix normal appearance. Uterus normal single, nontender. Adnexa no mass, fullness, tenderness.     Pelvic floor strength I/V  Pelvic floor musculature: Right levator non-tender, Right obturator non-tender, Left levator non-tender, Left obturator non-tender  POP-Q:   POP-Q  -3                                            Aa   -3  Ba  -8                                              C   3.5                                            Gh  3                                            Pb  9                                            tvl   -2.5                                            Ap  -2.5                                            Bp  -9                                              D      Rectal Exam:  Normal external rectum  Post-Void Residual (PVR) by Bladder Scan: In order to evaluate bladder emptying, we discussed obtaining a postvoid residual and she agreed to this procedure.  Procedure: The ultrasound unit was placed on the patient's abdomen in the suprapubic region after the patient had voided. A PVR of 2 ml was obtained by bladder scan.  Laboratory Results: POC urine: trace leukocytes  ASSESSMENT AND PLAN Ms. Eskildsen is a 62 y.o. with:  1. SUI (stress urinary incontinence, female)   2. Urinary frequency   3. Overactive bladder    SUI - For treatment of stress urinary incontinence,  non-surgical options include expectant management, weight loss, physical therapy, as well as a pessary.  Surgical options include a midurethral sling, Burch urethropexy, and transurethral injection of a bulking agent. - She is interested in urethral bulking. We discussed success rate of approximately 70-80% and that the bulking gel will dissolve after several years. Sometimes people require a second injection to achieve  full effect. Risks and benefits of procedure discussed and she would like to proceed.  - Will prescribe ciprofloxacin for her to take the day of the procedure.  - Needs to return for simple CMG to demonstrate leakage prior to bulking procedure.   2. OAB - We discussed the symptoms of overactive bladder (OAB), which include urinary urgency, urinary frequency, nocturia, with or without urge incontinence.  While we do not know the exact etiology of OAB, several treatment options exist. We discussed management including behavioral therapy (decreasing bladder irritants, urge suppression strategies,  timed voids, bladder retraining), physical therapy, medication; for refractory cases posterior tibial nerve stimulation, sacral neuromodulation, and intravesical botulinum toxin injection.  - Wants to wait to address OAB symptoms until after the bulking. She is not interested in a medication at this time.   Return for simple CMG   Jaquita Folds, MD

## 2022-11-09 ENCOUNTER — Encounter: Payer: Self-pay | Admitting: Obstetrics and Gynecology

## 2022-11-10 LAB — COMPREHENSIVE METABOLIC PANEL
ALT: 18 IU/L (ref 0–32)
AST: 21 IU/L (ref 0–40)
Albumin/Globulin Ratio: 2.3 — ABNORMAL HIGH (ref 1.2–2.2)
Albumin: 4.8 g/dL (ref 3.8–4.9)
Alkaline Phosphatase: 67 IU/L (ref 44–121)
BUN/Creatinine Ratio: 22 (ref 9–23)
BUN: 20 mg/dL (ref 6–24)
Bilirubin Total: 0.7 mg/dL (ref 0.0–1.2)
CO2: 26 mmol/L (ref 20–29)
Calcium: 10 mg/dL (ref 8.7–10.2)
Chloride: 101 mmol/L (ref 96–106)
Creatinine, Ser: 0.89 mg/dL (ref 0.57–1.00)
Globulin, Total: 2.1 g/dL (ref 1.5–4.5)
Glucose: 84 mg/dL (ref 70–99)
Potassium: 4 mmol/L (ref 3.5–5.2)
Sodium: 142 mmol/L (ref 134–144)
Total Protein: 6.9 g/dL (ref 6.0–8.5)
eGFR: 75 mL/min/{1.73_m2} (ref >59)

## 2022-11-10 LAB — LIPID PANEL
Chol/HDL Ratio: 2.4 ratio (ref 0.0–4.4)
Cholesterol, Total: 180 mg/dL (ref 100–199)
HDL: 75 mg/dL (ref >39)
LDL Chol Calc (NIH): 93 mg/dL (ref 0–99)
Triglycerides: 66 mg/dL (ref 0–149)
VLDL Cholesterol Cal: 12 mg/dL (ref 5–40)

## 2022-11-10 LAB — CBC
Hematocrit: 41.4 % (ref 34.0–46.6)
Hemoglobin: 13.7 g/dL (ref 11.1–15.9)
MCH: 31.6 pg (ref 26.6–33.0)
MCHC: 33.1 g/dL (ref 31.5–35.7)
MCV: 95 fL (ref 79–97)
Platelets: 282 10*3/uL (ref 150–450)
RBC: 4.34 x10E6/uL (ref 3.77–5.28)
RDW: 12.2 % (ref 11.7–15.4)
WBC: 5.8 10*3/uL (ref 3.4–10.8)

## 2022-11-10 LAB — TSH: TSH: 1.38 u[IU]/mL (ref 0.450–4.500)

## 2022-11-17 ENCOUNTER — Telehealth: Payer: Self-pay | Admitting: Obstetrics and Gynecology

## 2022-11-17 DIAGNOSIS — N393 Stress incontinence (female) (male): Secondary | ICD-10-CM

## 2022-11-17 MED ORDER — CIPROFLOXACIN HCL 500 MG PO TABS: 500.0000 mg | ORAL_TABLET | Freq: Two times a day (BID) | ORAL | 0 refills | Status: AC

## 2022-11-17 NOTE — Telephone Encounter (Signed)
Cipro prescription reordered.

## 2022-11-17 NOTE — Telephone Encounter (Signed)
Jill Robbins with [facility] called today saying that the Cipro prescription was not received.  They had a cyper attack that day and the prescription did not go through. She asked if you could Escribe again or call it in for the patient.

## 2022-11-23 ENCOUNTER — Encounter: Payer: Self-pay | Admitting: Obstetrics and Gynecology

## 2022-11-27 NOTE — Progress Notes (Unsigned)
Verbal consent was obtained to perform simple CMG procedure:   Prolapse was reduced using 2 large cotton swabs. Urethra was prepped with betadine and a 56F catheter was placed and bladder was drained completely. The bladder was then backfilled with sterile water by gravity.  First sensation: 30 First Desire: 100 Strong Desire: 200 Capacity: 600  Cough stress test was positive with standing. Valsalva stress test was positive while standing.  She was was allowed to void on her own.   Interpretation: CMG showed increased sensation, and within normal limits cystometric capacity. Findings positive for stress incontinence, positive for detrusor overactivity.     Plan to proceed with urethral bulking. As patient had a significant amount of anxiety with this procedure today, we discussed that she would probably benefit from a one time dose of Valium prior to urethral bulking procedure. She reported understanding and will have her husband drive her and will take the valium 30 minutes prior to procedure.

## 2022-11-28 ENCOUNTER — Ambulatory Visit: Admitting: Obstetrics and Gynecology

## 2022-11-28 ENCOUNTER — Encounter: Payer: Self-pay | Admitting: Obstetrics and Gynecology

## 2022-11-28 VITALS — BP 134/80 | HR 67

## 2022-11-28 DIAGNOSIS — N393 Stress incontinence (female) (male): Secondary | ICD-10-CM

## 2022-11-28 DIAGNOSIS — N3281 Overactive bladder: Secondary | ICD-10-CM

## 2022-11-28 DIAGNOSIS — R350 Frequency of micturition: Secondary | ICD-10-CM

## 2022-11-28 MED ORDER — DIAZEPAM 5 MG PO TABS: ORAL_TABLET | ORAL | 0 refills | Status: DC

## 2022-11-28 NOTE — Patient Instructions (Signed)
You did show stress incontinence today so we will plan to proceed with urethral bulking.   Taking Care of Yourself after Simple CMG   Drink plenty of water for a day or two following your procedure. Try to have about 8 ounces (one cup) at a time, and do this 6 times or more per day unless you have fluid restrictitons AVOID irritative beverages such as coffee, tea, soda, alcoholic or citrus drinks for a day or two, as this may cause burning with urination.  You may experience some discomfort or a burning sensation with urination after having this procedure. You can use over the counter Azo or pyridium to help with burning and follow the instructions on the packaging. If it does not improve within 1-2 days, or other symptoms appear (fever, chills, or difficulty urinating) call the office to speak to a nurse.  You may return to normal daily activities such as work, school, driving, exercising and housework on the day of the procedure.

## 2023-01-05 ENCOUNTER — Encounter: Payer: Self-pay | Admitting: Obstetrics and Gynecology

## 2023-01-05 ENCOUNTER — Ambulatory Visit: Admitting: Obstetrics and Gynecology

## 2023-01-05 VITALS — BP 132/93 | HR 107

## 2023-01-05 DIAGNOSIS — R350 Frequency of micturition: Secondary | ICD-10-CM | POA: Diagnosis not present

## 2023-01-05 DIAGNOSIS — N393 Stress incontinence (female) (male): Secondary | ICD-10-CM

## 2023-01-05 LAB — POCT URINALYSIS DIPSTICK
Bilirubin, UA: NEGATIVE
Blood, UA: NEGATIVE
Glucose, UA: NEGATIVE
Ketones, UA: NEGATIVE
Nitrite, UA: NEGATIVE
Protein, UA: NEGATIVE
Spec Grav, UA: 1.02 (ref 1.010–1.025)
Urobilinogen, UA: 0.2 E.U./dL
pH, UA: 7 (ref 5.0–8.0)

## 2023-01-05 MED ORDER — LIDOCAINE HCL URETHRAL/MUCOSAL 2 % EX GEL
1.0000 | Freq: Once | CUTANEOUS | Status: AC
  Administered 2023-01-05: 1 via URETHRAL

## 2023-01-05 MED ORDER — LIDOCAINE-EPINEPHRINE 1 %-1:100000 IJ SOLN
8.0000 mL | Freq: Once | INTRAMUSCULAR | Status: AC
  Administered 2023-01-05: 8 mL

## 2023-01-05 NOTE — Progress Notes (Signed)
Bulkamid Injection  CC: 61 y.o. y.o. F with stress incontinence who presents for transurethral Bulkamid injection.  Patient signed her consent form.  She started antibiotic prophylaxis today.  POC urine: trace leukocytes, negative nitrites  Procedure: Time out was performed. The bladder was catheterized and 10 ml of 2% lidocaine jelly placed in the urethra. A urethral block was performed by injecting 4ml of 1% lidocaine with epinephrine at 3 and 6 o'clock adjacent to the urethra.  The needle was primed.  The cystoscope was inserted to the level of the bladder neck.  The needle was inserted 2 cm and the scope was pulled back into the urethra 2 cm.  The needle was inserted bevel up at the 5 o'clock position and the Bulkamid was injected to obtain coaptation.  This was repeated at the 2 o'clock,  10 o'clock and 7 o'clock positions.   A total of 1.8- 1ml syringes were used and good circumferential coaptation was noted.  The patient tolerated the procedure well. She was asked to void after the procedure.  Post-Void Residual (PVR) by Bladder Scan: In order to evaluate bladder emptying, we discussed obtaining a postvoid residual and she agreed to this procedure.  Procedure: The ultrasound unit was placed on the patient's abdomen in the suprapubic region after the patient had voided. A PVR of 5 ml was obtained by bladder scan.  ASSESSMENT: 62 y.o. y.o. s/p transurethral Bulkamid injection for stress incontinence.   PLAN: Patient will follow up in 4 weeks to reassess. Voiding and post-procedure precautions were given. She will return for heavy bleeding, fevers, dysuria lasting beyond today and incomplete emptying.  All questions were answered.  Marguerita Beards, MD

## 2023-01-05 NOTE — Patient Instructions (Signed)
Taking Care of Yourself after Urodynamics, Cystoscopy, Bulkamid Injection, or Botox Injection   Drink plenty of water for a day or two following your procedure. Try to have about 8 ounces (one cup) at a time, and do this 6 times or more per day unless you have fluid restrictitons AVOID irritative beverages such as coffee, tea, soda, alcoholic or citrus drinks for a day or two, as this may cause burning with urination.  For the first 1-2 days after the procedure, your urine may be pink or red in color. You may have some blood in your urine as a normal side effect of the procedure. Large amounts of bleeding or difficulty urinating are NOT normal. Call the nurse line if this happens or go to the nearest Emergency Room if the bleeding is heavy or you cannot urinate at all and it is after hours. If you had a Bulkamid injection in the urethra and need to be catheterized, ask for a pediatric catheter to be used (size 10 or 12-French) so the material is not pushed out of place.   You may experience some discomfort or a burning sensation with urination after having this procedure. You can use over the counter Azo or pyridium to help with burning and follow the instructions on the packaging. If it does not improve within 1-2 days, or other symptoms appear (fever, chills, or difficulty urinating) call the office to speak to a nurse.  You may return to normal daily activities such as work, school, driving, exercising and housework on the day of the procedure. If your doctor gave you a prescription, take it as ordered.     

## 2023-01-31 NOTE — Progress Notes (Unsigned)
[facility] Return Visit  SUBJECTIVE  History of Present Illness: Jill Robbins is a 62 y.o. female seen in follow-up for Urethral bulking .     Past Medical History: Patient  has a past medical history of Anxiety, Arthritis, Atypical hyperplasia of left breast, Complication of anesthesia, Family history of adverse reaction to anesthesia, HTN (hypertension), Osteopenia, and Succinylcholine adverse reaction.   Past Surgical History: She  has a past surgical history that includes Endometrial ablation; Rhinoplasty (1995); adnoids; Wisdom tooth extraction; sclero theray (09/21/2016); Labioplasty (Bilateral, 09/22/2017); Breast biopsy (Right, 12/08/2017); Breast biopsy (Left); and Breast lumpectomy with radioactive seed localization (Left, 08/27/2020).   Medications: She has a current medication list which includes the following prescription(s): buspirone, calcium-vitamin d, cetirizine, ciprofloxacin, diazepam, glucosamine-chondroitin, multivitamin with minerals, omega-3 fatty acids, miebo, and triamterene-hydrochlorothiazide.   Allergies: Patient is allergic to succinylcholine, sulfa antibiotics, other, and peanut-containing drug products.   Social History: Patient  reports that she has never smoked. She has never used smokeless tobacco. She reports current alcohol use. She reports that she does not use drugs.      OBJECTIVE     Physical Exam: Vitals:   02/01/23 1537  BP: 131/87  Pulse: 75   Gen: No apparent distress, A&O x 3.  Detailed Urogynecologic Evaluation:  Deferred. Prior exam showed:      No data to display             ASSESSMENT AND PLAN    Jill Robbins is a 62 y.o. with:  1. Overactive bladder   2. SUI (stress urinary incontinence, female)   3. Urinary frequency    Patient is still having some significant leaking and urgency. Will start patient on Myrbetriq 25mg  and see if this is helpful in controlling some of the urge and frequency.  Patient reports  some improvement in leakage but she still has a gush of leakage demonstrated on her pad. Unsure if this is stress leakage or urge leakage, but from description of urgency will see if the leakage is decreased on medications.  Start Myrbetriq 25mg  daily and we will reevaluate symptoms.   Patient to return in 6 weeks or sooner if needed.

## 2023-02-01 ENCOUNTER — Ambulatory Visit (INDEPENDENT_AMBULATORY_CARE_PROVIDER_SITE_OTHER): Admitting: Obstetrics and Gynecology

## 2023-02-01 ENCOUNTER — Encounter: Payer: Self-pay | Admitting: Obstetrics and Gynecology

## 2023-02-01 VITALS — BP 131/87 | HR 75

## 2023-02-01 DIAGNOSIS — N393 Stress incontinence (female) (male): Secondary | ICD-10-CM

## 2023-02-01 DIAGNOSIS — N3281 Overactive bladder: Secondary | ICD-10-CM | POA: Diagnosis not present

## 2023-02-01 DIAGNOSIS — R350 Frequency of micturition: Secondary | ICD-10-CM

## 2023-02-01 NOTE — Patient Instructions (Signed)
Start Myrbetriq 25mg  daily. This is the lower dose so we will start there. If it is helpful and you feel there is still room for improvement we can increase to 50mg .

## 2023-02-22 ENCOUNTER — Encounter: Payer: Self-pay | Admitting: Obstetrics and Gynecology

## 2023-02-22 ENCOUNTER — Other Ambulatory Visit: Payer: Self-pay | Admitting: Obstetrics and Gynecology

## 2023-03-31 ENCOUNTER — Other Ambulatory Visit: Payer: Self-pay | Admitting: Family Medicine

## 2023-03-31 DIAGNOSIS — I10 Essential (primary) hypertension: Secondary | ICD-10-CM

## 2023-03-31 NOTE — Telephone Encounter (Signed)
Requested Prescriptions  Refused Prescriptions Disp Refills   triamterene-hydrochlorothiazide (MAXZIDE-25) 37.5-25 MG tablet [Pharmacy Med Name: TRIAMTERENE 37.5MG / HCTZ25MG  TABS] 90 tablet 1    Sig: TAKE 1 TABLET BY MOUTH EVERY DAY     Cardiovascular: Diuretic Combos Passed - 03/31/2023  6:24 AM      Passed - K in normal range and within 180 days    Potassium  Date Value Ref Range Status  11/09/2022 4.0 3.5 - 5.2 mmol/L Final         Passed - Na in normal range and within 180 days    Sodium  Date Value Ref Range Status  11/09/2022 142 134 - 144 mmol/L Final         Passed - Cr in normal range and within 180 days    Creat  Date Value Ref Range Status  06/23/2017 0.73 0.50 - 1.05 mg/dL Final    Comment:    For patients >18 years of age, the reference limit for Creatinine is approximately 13% higher for people identified as African-American. .    Creatinine, Ser  Date Value Ref Range Status  11/09/2022 0.89 0.57 - 1.00 mg/dL Final         Passed - Last BP in normal range    BP Readings from Last 1 Encounters:  02/01/23 131/87         Passed - Valid encounter within last 6 months    Recent Outpatient Visits           4 months ago Encounter for annual physical exam   [place] [facility] [place], Marzella Schlein, MD   9 months ago Essential hypertension   [place] [facility] Caro Laroche, DO   1 year ago Acute frontal sinusitis, recurrence not specified   [facility] Caro Laroche, DO   1 year ago Acute non-recurrent maxillary sinusitis   [place] [facility] Mar[place], Marzella Schlein, MD   1 year ago Encounter for annual physical exam   [place] [facility] [place], Marzella Schlein, MD       Future Appointments             In 1 month Bacigalupo, Marzella Schlein, MD [facility]

## 2023-04-12 ENCOUNTER — Other Ambulatory Visit: Payer: Self-pay | Admitting: Obstetrics and Gynecology

## 2023-04-12 DIAGNOSIS — Z1231 Encounter for screening mammogram for malignant neoplasm of breast: Secondary | ICD-10-CM

## 2023-05-09 ENCOUNTER — Ambulatory Visit: Admitting: Family Medicine

## 2023-05-09 ENCOUNTER — Encounter: Payer: Self-pay | Admitting: Family Medicine

## 2023-05-09 VITALS — BP 109/78 | HR 78 | Temp 97.8°F | Resp 12 | Ht 70.0 in | Wt 147.1 lb

## 2023-05-09 DIAGNOSIS — Z23 Encounter for immunization: Secondary | ICD-10-CM

## 2023-05-09 DIAGNOSIS — Z8249 Family history of ischemic heart disease and other diseases of the circulatory system: Secondary | ICD-10-CM

## 2023-05-09 DIAGNOSIS — I10 Essential (primary) hypertension: Secondary | ICD-10-CM

## 2023-05-09 DIAGNOSIS — N951 Menopausal and female climacteric states: Secondary | ICD-10-CM

## 2023-05-09 DIAGNOSIS — R6889 Other general symptoms and signs: Secondary | ICD-10-CM | POA: Diagnosis not present

## 2023-05-09 DIAGNOSIS — F419 Anxiety disorder, unspecified: Secondary | ICD-10-CM

## 2023-05-09 MED ORDER — TRIAMTERENE-HCTZ 37.5-25 MG PO TABS
1.0000 | ORAL_TABLET | Freq: Every day | ORAL | 1 refills | Status: DC
Start: 2023-05-09 — End: 2023-10-04

## 2023-05-09 MED ORDER — BUSPIRONE HCL 7.5 MG PO TABS
7.5000 mg | ORAL_TABLET | Freq: Two times a day (BID) | ORAL | 3 refills | Status: DC
Start: 2023-05-09 — End: 2024-01-04

## 2023-05-09 NOTE — Assessment & Plan Note (Signed)
Unclear etiology  TSH nml in February, will recheck TSH now

## 2023-05-09 NOTE — Assessment & Plan Note (Signed)
Well controlled  In office BP was at goal at 109 / 78  Continue current meds  Recheck CMP

## 2023-05-09 NOTE — Progress Notes (Signed)
Established Patient Office Visit  Subjective   Patient ID: Jill Robbins, female    DOB: 1961-04-03  Age: 62 y.o. MRN: [id number]  Chief Complaint  Patient presents with   Medical Management of Chronic Issues    Jill Robbins is here for medical management of chronic conditions. She has been doing well. She denies symptoms of too high or too low of BP. She is happy with current BP management. Wants to get Tdap vaccine.  She wants a refill of her HTN and anxiety medications. She is reporting heat intolerance. For the past two years, she has experienced feeling lightheaded and like she is going to pass out from being in the heat for < 1 minute.   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  Psychiatric/Behavioral:  The patient is nervous/anxious.       Objective:     BP 109/78 (BP Location: Left Arm, Patient Position: Sitting, Cuff Size: Normal)   Pulse 78   Temp 97.8 F (36.6 C) (Temporal)   Resp 12   Ht 5\' 10"  (1.778 m)   Wt 147 lb 1.6 oz (66.7 kg)   SpO2 99%   BMI 21.11 kg/m     Physical Exam Constitutional:      Appearance: Normal appearance.  Cardiovascular:     Rate and Rhythm: Normal rate and regular rhythm.     Heart sounds: Normal heart sounds.  Pulmonary:     Effort: Pulmonary effort is normal.     Breath sounds: Normal breath sounds.  Neurological:     General: No focal deficit present.     Mental Status: She is alert and oriented to person, place, and time.    No results found for any visits on 05/09/23.   The 10-year ASCVD  risk score (Arnett DK, et al., 2019) is: 2.4%    Assessment & Plan:   Problem List Items Addressed This Visit       Cardiovascular and Mediastinum   Essential hypertension - Primary    Well controlled  In office BP was at goal at 109 / 78  Continue current meds  Recheck CMP      Relevant Medications   triamterene-hydrochlorothiazide (MAXZIDE-25) 37.5-25 MG tablet   Other Relevant Orders   Basic Metabolic Panel (BMET)   CT CARDIAC SCORING (SELF PAY ONLY)   Hot flashes due to menopause    Longstanding issue, uncontrolled No longer on HRT due to pre-cancerous changes in her breast d/c gabapentin due to side effects  Pt prefers to discuss other options with OBGYN      Relevant Medications   triamterene-hydrochlorothiazide (MAXZIDE-25) 37.5-25 MG tablet     Other   Anxiety    Chronic and well controlled Continue buspar at current dose      Relevant Medications   busPIRone (BUSPAR) 7.5 MG tablet   Heat intolerance    Unclear etiology  TSH nml in February, will recheck TSH now       Relevant Orders   TSH   Family history of heart  disease    Father had blockage in LAD  Cardiac CT scan ordered        Relevant Orders   CT CARDIAC SCORING (SELF PAY ONLY)   Other Visit Diagnoses     Need for Tdap vaccination       Relevant Orders   Tdap vaccine greater than or equal to 7yo IM (Completed)       Return in about 6 months (around 11/09/2023) for CPE.    Rometta Emery, Medical Student  Patient seen along with MS3 student Jodi Marble. I personally evaluated this patient along with the student, and verified all aspects of the history, physical exam, and medical decision making as documented by the student. I agree with the student's documentation and have made all necessary edits.  Cuauhtemoc Huegel, Marzella Schlein, MD, MPH [facility]

## 2023-05-09 NOTE — Assessment & Plan Note (Signed)
Chronic and well controlled Continue buspar at current dose 

## 2023-05-09 NOTE — Assessment & Plan Note (Signed)
Father had blockage in LAD  Cardiac CT scan ordered

## 2023-05-09 NOTE — Assessment & Plan Note (Signed)
Longstanding issue, uncontrolled No longer on HRT due to pre-cancerous changes in her breast d/c gabapentin due to side effects  Pt prefers to discuss other options with OBGYN

## 2023-05-10 ENCOUNTER — Encounter: Payer: Self-pay | Admitting: Family Medicine

## 2023-05-10 LAB — BASIC METABOLIC PANEL
BUN/Creatinine Ratio: 23 (ref 12–28)
BUN: 17 mg/dL (ref 8–27)
CO2: 29 mmol/L (ref 20–29)
Calcium: 10.3 mg/dL (ref 8.7–10.3)
Chloride: 92 mmol/L — ABNORMAL LOW (ref 96–106)
Creatinine, Ser: 0.74 mg/dL (ref 0.57–1.00)
Glucose: 94 mg/dL (ref 70–99)
Potassium: 3.6 mmol/L (ref 3.5–5.2)
Sodium: 138 mmol/L (ref 134–144)
eGFR: 93 mL/min/{1.73_m2} (ref >59)

## 2023-05-10 LAB — TSH: TSH: 1.16 u[IU]/mL (ref 0.450–4.500)

## 2023-05-23 ENCOUNTER — Encounter: Payer: Self-pay | Admitting: Physician Assistant

## 2023-05-23 ENCOUNTER — Ambulatory Visit: Payer: Self-pay

## 2023-05-23 ENCOUNTER — Ambulatory Visit (INDEPENDENT_AMBULATORY_CARE_PROVIDER_SITE_OTHER): Admitting: Physician Assistant

## 2023-05-23 VITALS — BP 106/78 | HR 75 | Ht 69.0 in | Wt 146.2 lb

## 2023-05-23 DIAGNOSIS — J301 Allergic rhinitis due to pollen: Secondary | ICD-10-CM | POA: Diagnosis not present

## 2023-05-23 DIAGNOSIS — L718 Other rosacea: Secondary | ICD-10-CM | POA: Insufficient documentation

## 2023-05-23 DIAGNOSIS — L237 Allergic contact dermatitis due to plants, except food: Secondary | ICD-10-CM

## 2023-05-23 MED ORDER — PREDNISONE 20 MG PO TABS: 20.0000 mg | ORAL_TABLET | Freq: Every day | ORAL | 0 refills | Status: DC

## 2023-05-23 MED ORDER — TRIAMCINOLONE ACETONIDE 0.5 % EX OINT: 1.0000 | TOPICAL_OINTMENT | Freq: Two times a day (BID) | CUTANEOUS | 0 refills | Status: DC

## 2023-05-23 MED ORDER — LEVOCETIRIZINE DIHYDROCHLORIDE 5 MG PO TABS: 5.0000 mg | ORAL_TABLET | Freq: Every evening | ORAL | 2 refills | Status: DC

## 2023-05-23 NOTE — Telephone Encounter (Signed)
  Chief Complaint: rash with blisters redness and looks looks purple in the middle of the blisters. To right anterior rash is 2.5 inches wide. Posterior elbow with pinpoint blisters and red streaks . Left fore arms with small area red with pinpoint blisters Symptoms: dull ache Frequency: 1 week Pertinent Negatives: Patient denies itching or fever Disposition: [] ED /[] Urgent Care (no appt availability in office) / [x] Appointment(In office/virtual)/ []  [facility]/ [] Home Care/ [] Refused Recommended Disposition /[] [place]/ []  Follow-up with PCP Additional Notes: pt accepted 1 pm appt today  Reason for Disposition  Large blisters or oozing sores  Answer Assessment - Initial Assessment Questions 1. APPEARANCE of RASH: "Describe the rash."      Right arm 2 .5 inches blisters around perimeter and beet red in the center back of arm looks like thin streaks of blisters red /left arm 2 dots blisters swelling looks purple in the middle - dull ache swelling  2. LOCATION: "Where is the rash located?"  (e.g., face, genitals, hands, legs)     Right and l 3. SIZE: "How large is the rash?"      2.5 inches  4. ONSET: "When did the rash begin?"      1 week ago  5. ITCHING: "Does the rash itch?" If Yes, ask: "How bad is it?"   - MILD - doesn't interfere with normal activities   - MODERATE-SEVERE: interferes with work, school, sleep, or other activities      Zansel calamine - no itching  6. EXPOSURE:  "How were you exposed to the plant (poison ivy, poison oak, sumac)"  "When were you exposed?"       Poison sumac  Protocols used: Poison Ivy - Oak - [facility]

## 2023-05-23 NOTE — Progress Notes (Unsigned)
Established patient visit  Patient: Jill Robbins   DOB: 11/10/1960   62 y.o. Female  MRN: [id number] Visit Date: 05/23/2023  Today's healthcare provider: Debera Lat, PA-C   Chief Complaint  Patient presents with   Rash    Present 1 week and not going away. Located on right arm inner elbow and back of arm, also reports a few dot on left arm and few possible dots above right eye. Patient has tried to use zanafel, calamine lotion, and cold compress with no changes.    Subjective     Discussed the use of AI scribe software for clinical note transcription with the patient, who gave verbal consent to proceed.  History of Present Illness   The patient, with a history of skin reactions to poison ivy or sumac, presents with a skin reaction that started a week ago. The reaction is characterized by a large, discolored, and warm patch on the skin that is painful but not itchy. The patient reports pulling ivy from their mother's yard prior to the onset of the reaction. The patient denies seeing any poison ivy or poison oak but suspects contact with sumac vines. The patient has had a similar reaction in the past that required a shot due to oozing from the entire arm. The current reaction, however, has not oozed but is not subsiding.  The patient also reports a recent completion of a prednisone treatment prescribed by Dr. Ether Griffins for a heel injury. The patient is currently on doxycycline for rosacea in the eyes, prescribed by Dr. Marton Redwood, and takes over-the-counter antihistamines (Allertec, similar to Zyrtec) daily for allergies. The patient has been on antihistamines since third grade and has been taking Zyrtec for years.           05/09/2023    2:21 PM 11/07/2022   10:41 AM 06/14/2022   10:29 AM  Depression screen PHQ 2/9  Decreased Interest 1 0 0  Down, Depressed, Hopeless 1 1 0  PHQ - 2 Score 2 1 0  Altered sleeping 1 2 1   Tired, decreased energy 1 1 1   Change in appetite 1 0 0  Feeling bad or  failure about yourself  0 0 0  Trouble concentrating 0 0 0  Moving slowly or fidgety/restless 0 1 0  Suicidal thoughts 0 0 0  PHQ-9 Score 5 5 2   Difficult doing work/chores Not difficult at all Somewhat difficult Not difficult at all      05/09/2023    2:22 PM 06/14/2022   10:22 AM 06/29/2021    8:33 AM 03/08/2019   11:56 AM  GAD 7 : Generalized Anxiety Score  Nervous, Anxious, on Edge 2 1 0 3  Control/stop worrying 3 3 0 3  Worry too much - different things 3 3 0 3  Trouble relaxing 2 1 0 1  Restless 0 0 1 0  Easily annoyed or irritable 1 0 0 1  Afraid - awful might happen 0 0 0 2  Total GAD 7 Score 11 8 1 13   Anxiety Difficulty Not difficult at all Not difficult at all Not difficult at all Somewhat difficult    Medications: Outpatient Medications Prior to Visit  Medication Sig   busPIRone (BUSPAR) 7.5 MG tablet Take 1 tablet (7.5 mg total) by mouth 2 (two) times daily.   Calcium Carb-Cholecalciferol (CALCIUM-VITAMIN D) 500-200 MG-UNIT tablet Take 2 tablets by mouth daily.    doxycycline (ORACEA) 40 MG capsule Take 40 mg by mouth daily.   GLUCOSAMINE-CHONDROITIN  PO Take 2 tablets by mouth daily.   Multiple Vitamin (MULTIVITAMIN WITH MINERALS) TABS tablet Take 2 tablets by mouth daily.   Omega-3 Fatty Acids (FISH OIL PO) Take 2 capsules by mouth daily.   Perfluorohexyloctane (MIEBO) 1.338 GM/ML SOLN    triamterene-hydrochlorothiazide (MAXZIDE-25) 37.5-25 MG tablet Take 1 tablet by mouth daily.   [DISCONTINUED] cetirizine (ZYRTEC) 10 MG tablet Take 1 tablet (10 mg total) by mouth daily.   No facility-administered medications prior to visit.    Review of Systems  All other systems reviewed and are negative.  Except see HPI   {Insert previous labs (optional):23779} {See past labs  Heme  Chem  Endocrine  Serology  Results Review (optional):1}   Objective    BP 106/78 (BP Location: Left Arm, Patient Position: Sitting, Cuff Size: Normal)   Pulse 75   Ht 5\' 9"  (1.753 m)    Wt 146 lb 3.2 oz (66.3 kg)   SpO2 100%   BMI 21.59 kg/m  {Insert last BP/Wt (optional):23777}{See vitals history (optional):1}   Physical Exam Constitutional:      General: She is not in acute distress.    Appearance: Normal appearance.  HENT:     Head: Normocephalic.  Pulmonary:     Effort: Pulmonary effort is normal. No respiratory distress.  Skin:    Findings: Erythema, lesion and rash present.  Neurological:     Mental Status: She is alert and oriented to person, place, and time. Mental status is at baseline.      No results found for any visits on 05/23/23.  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 Noted to be painful and itchy. History of severe reaction requiring systemic steroids. -Prescribe Prednisone 40mg  for 5 days, then 30mg  for 5 days, then 20mg  for 5 days. -Prescribe topical steroid ointment. -Advise cold compresses and use of cooled tea bags for symptomatic relief. Seasonal allergies Chronic Antihistamine Use Long-term use of Zyrtec (Allertec) for unspecified allergy. -Switch to Xyzal due to potential tolerance development.  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 -Continue current management.  Heel Injury Recently completed a course of Prednisone prescribed by Dr. Ether Griffins. -No further action required at this time.  Follow-up Monitor response to treatment and adjust as necessary. If symptoms persist or worsen, consider extending Prednisone course.      No follow-ups on file.     The patient was advised to call back or seek an in-person evaluation if the symptoms worsen or if the condition fails to improve as anticipated.  I discussed the assessment and treatment plan with the patient. The patient was provided an opportunity to ask questions and all were answered. The patient agreed with the plan and demonstrated an understanding of the instructions.  I, Debera Lat, PA-C have reviewed all documentation for this visit. The documentation on  05/23/23  for the exam, diagnosis, procedures, and orders are all accurate and complete.  Debera Lat, [facility], MMS [facility] [phone] (phone) [phone] (fax)  [facility]

## 2023-06-01 ENCOUNTER — Ambulatory Visit
Admission: RE | Admit: 2023-06-01 | Discharge: 2023-06-01 | Disposition: A | Discharge status: Home / Self Care | Payer: Self-pay | Source: Ambulatory Visit | Attending: Family Medicine | Admitting: Family Medicine

## 2023-06-01 DIAGNOSIS — Z8249 Family history of ischemic heart disease and other diseases of the circulatory system: Secondary | ICD-10-CM | POA: Insufficient documentation

## 2023-06-01 DIAGNOSIS — I10 Essential (primary) hypertension: Secondary | ICD-10-CM | POA: Insufficient documentation

## 2023-06-27 ENCOUNTER — Ambulatory Visit
Admission: RE | Admit: 2023-06-27 | Discharge: 2023-06-27 | Disposition: A | Discharge status: Home / Self Care | Source: Ambulatory Visit | Attending: Obstetrics and Gynecology

## 2023-06-27 DIAGNOSIS — Z1231 Encounter for screening mammogram for malignant neoplasm of breast: Secondary | ICD-10-CM

## 2023-08-18 ENCOUNTER — Other Ambulatory Visit: Payer: Self-pay | Admitting: Family Medicine

## 2023-08-18 DIAGNOSIS — L237 Allergic contact dermatitis due to plants, except food: Secondary | ICD-10-CM

## 2023-08-30 ENCOUNTER — Other Ambulatory Visit: Payer: Self-pay | Admitting: Podiatry

## 2023-08-31 ENCOUNTER — Encounter: Payer: Self-pay | Admitting: Podiatry

## 2023-09-01 ENCOUNTER — Encounter: Payer: Self-pay | Admitting: Podiatry

## 2023-09-01 NOTE — Anesthesia Preprocedure Evaluation (Addendum)
Anesthesia Evaluation    Airway        Dental   Pulmonary           Cardiovascular hypertension,      Neuro/Psych    GI/Hepatic   Endo/Other    Renal/GU      Musculoskeletal   Abdominal   Peds  Hematology   Anesthesia Other Findings HTN (hypertension)  Osteopenia Anxiety Arthritis  Complication of anesthesia Succinylcholine adverse reaction--uncle was slow to awaken. Has been told since childhood that "she" has a "genetic deficiency" and should not have succinylcholine, and she never has had it.  Atypical hyperplasia of left breast Claustrophobia---feels like she "can't breathe" when she has mask over her face for induction    Reproductive/Obstetrics                             Anesthesia Physical Anesthesia Plan Anesthesia Quick Evaluation

## 2023-09-14 NOTE — Discharge Instructions (Signed)

## 2023-09-20 ENCOUNTER — Ambulatory Visit: Admitting: Anesthesiology

## 2023-09-20 ENCOUNTER — Other Ambulatory Visit: Payer: Self-pay

## 2023-09-20 ENCOUNTER — Encounter
Admission: RE | Disposition: A | Discharge status: Home / Self Care | Payer: Self-pay | Source: Home / Self Care | Attending: Podiatry

## 2023-09-20 ENCOUNTER — Encounter: Payer: Self-pay | Admitting: Podiatry

## 2023-09-20 ENCOUNTER — Ambulatory Visit: Payer: Self-pay

## 2023-09-20 ENCOUNTER — Ambulatory Visit
Admission: RE | Admit: 2023-09-20 | Discharge: 2023-09-20 | Disposition: A | Discharge status: Home / Self Care | Attending: Podiatry | Admitting: Podiatry

## 2023-09-20 DIAGNOSIS — I10 Essential (primary) hypertension: Secondary | ICD-10-CM | POA: Insufficient documentation

## 2023-09-20 DIAGNOSIS — M8580 Other specified disorders of bone density and structure, unspecified site: Secondary | ICD-10-CM | POA: Insufficient documentation

## 2023-09-20 DIAGNOSIS — M205X1 Other deformities of toe(s) (acquired), right foot: Secondary | ICD-10-CM | POA: Diagnosis present

## 2023-09-20 HISTORY — PX: CHEILECTOMY: SHX1336

## 2023-09-20 HISTORY — DX: Claustrophobia: F40.240

## 2023-09-20 HISTORY — DX: Personal history of diseases of the skin and subcutaneous tissue: Z87.2

## 2023-09-20 SURGERY — CHEILECTOMY
Anesthesia: General | Site: Foot | Laterality: Right

## 2023-09-20 MED ORDER — SODIUM CHLORIDE 0.9 % IV SOLN: INTRAVENOUS | Status: DC | PRN

## 2023-09-20 MED ORDER — BUPIVACAINE LIPOSOME 1.3 % IJ SUSP
INTRAMUSCULAR | Status: AC
  Filled 2023-09-20: qty 10

## 2023-09-20 MED ORDER — ONDANSETRON HCL 4 MG/2ML IJ SOLN
INTRAMUSCULAR | Status: AC
  Filled 2023-09-20: qty 2

## 2023-09-20 MED ORDER — BUPIVACAINE LIPOSOME 1.3 % IJ SUSP
INTRAMUSCULAR | Status: DC | PRN
  Administered 2023-09-20: 10 mL

## 2023-09-20 MED ORDER — EPHEDRINE 5 MG/ML INJ
INTRAVENOUS | Status: AC
  Filled 2023-09-20: qty 5

## 2023-09-20 MED ORDER — METOCLOPRAMIDE HCL 5 MG PO TABS: 5.0000 mg | ORAL_TABLET | Freq: Three times a day (TID) | ORAL | Status: DC | PRN

## 2023-09-20 MED ORDER — PROPOFOL 1000 MG/100ML IV EMUL
INTRAVENOUS | Status: AC
  Filled 2023-09-20: qty 100

## 2023-09-20 MED ORDER — MIDAZOLAM HCL 2 MG/2ML IJ SOLN
INTRAMUSCULAR | Status: AC
  Filled 2023-09-20: qty 2

## 2023-09-20 MED ORDER — ACETAMINOPHEN 10 MG/ML IV SOLN
INTRAVENOUS | Status: DC | PRN
  Administered 2023-09-20: 1000 mg via INTRAVENOUS

## 2023-09-20 MED ORDER — MIDAZOLAM HCL 5 MG/5ML IJ SOLN
INTRAMUSCULAR | Status: DC | PRN
  Administered 2023-09-20: 2 mg via INTRAVENOUS

## 2023-09-20 MED ORDER — LIDOCAINE HCL (CARDIAC) PF 100 MG/5ML IV SOSY
PREFILLED_SYRINGE | INTRAVENOUS | Status: DC | PRN
  Administered 2023-09-20: 100 mg via INTRATRACHEAL

## 2023-09-20 MED ORDER — DEXAMETHASONE SODIUM PHOSPHATE 4 MG/ML IJ SOLN
INTRAMUSCULAR | Status: DC | PRN
  Administered 2023-09-20: 4 mg via INTRAVENOUS

## 2023-09-20 MED ORDER — FENTANYL CITRATE (PF) 100 MCG/2ML IJ SOLN
INTRAMUSCULAR | Status: DC | PRN
  Administered 2023-09-20 (×2): 50 ug via INTRAVENOUS

## 2023-09-20 MED ORDER — EPHEDRINE SULFATE (PRESSORS) 50 MG/ML IJ SOLN
INTRAMUSCULAR | Status: DC | PRN
  Administered 2023-09-20 (×2): 5 mg via INTRAVENOUS

## 2023-09-20 MED ORDER — PROPOFOL 10 MG/ML IV BOLUS
INTRAVENOUS | Status: AC
  Filled 2023-09-20: qty 20

## 2023-09-20 MED ORDER — PROPOFOL 10 MG/ML IV BOLUS
INTRAVENOUS | Status: DC | PRN
  Administered 2023-09-20: 200 mg via INTRAVENOUS

## 2023-09-20 MED ORDER — PROPOFOL 500 MG/50ML IV EMUL
INTRAVENOUS | Status: DC | PRN
  Administered 2023-09-20: 100 ug/kg/min via INTRAVENOUS

## 2023-09-20 MED ORDER — BUPIVACAINE HCL (PF) 0.25 % IJ SOLN
INTRAMUSCULAR | Status: DC | PRN
  Administered 2023-09-20: 10 mL
  Administered 2023-09-20: 4 mL

## 2023-09-20 MED ORDER — ACETAMINOPHEN 10 MG/ML IV SOLN
INTRAVENOUS | Status: AC
  Filled 2023-09-20: qty 100

## 2023-09-20 MED ORDER — FENTANYL CITRATE (PF) 100 MCG/2ML IJ SOLN
INTRAMUSCULAR | Status: AC
  Filled 2023-09-20: qty 2

## 2023-09-20 MED ORDER — CEFAZOLIN SODIUM-DEXTROSE 2-4 GM/100ML-% IV SOLN
2.0000 g | INTRAVENOUS | Status: AC
  Administered 2023-09-20: 2 g via INTRAVENOUS

## 2023-09-20 MED ORDER — ONDANSETRON HCL 4 MG/2ML IJ SOLN: 4.0000 mg | Freq: Four times a day (QID) | INTRAMUSCULAR | Status: DC | PRN

## 2023-09-20 MED ORDER — OXYCODONE-ACETAMINOPHEN 5-325 MG PO TABS: 1.0000 | ORAL_TABLET | Freq: Four times a day (QID) | ORAL | 0 refills | Status: DC | PRN

## 2023-09-20 MED ORDER — LIDOCAINE HCL (PF) 2 % IJ SOLN
INTRAMUSCULAR | Status: AC
  Filled 2023-09-20: qty 5

## 2023-09-20 MED ORDER — BUPIVACAINE LIPOSOME 1.3 % IJ SUSP
INTRAMUSCULAR | Status: AC
  Filled 2023-09-20: qty 20

## 2023-09-20 MED ORDER — METOCLOPRAMIDE HCL 5 MG/ML IJ SOLN
5.0000 mg | Freq: Three times a day (TID) | INTRAMUSCULAR | Status: DC | PRN
Start: 2023-09-20 — End: 2023-09-20

## 2023-09-20 MED ORDER — DEXAMETHASONE SODIUM PHOSPHATE 4 MG/ML IJ SOLN
INTRAMUSCULAR | Status: AC
  Filled 2023-09-20: qty 1

## 2023-09-20 MED ORDER — SODIUM CHLORIDE 0.9 % IV SOLN: Freq: Once | INTRAVENOUS | Status: AC

## 2023-09-20 MED ORDER — SEVOFLURANE IN SOLN
RESPIRATORY_TRACT | Status: AC
  Filled 2023-09-20: qty 250

## 2023-09-20 MED ORDER — ONDANSETRON HCL 4 MG/2ML IJ SOLN
INTRAMUSCULAR | Status: DC | PRN
  Administered 2023-09-20: 4 mg via INTRAVENOUS

## 2023-09-20 MED ORDER — ONDANSETRON HCL 4 MG PO TABS: 4.0000 mg | ORAL_TABLET | Freq: Four times a day (QID) | ORAL | Status: DC | PRN

## 2023-09-20 MED ORDER — CEFAZOLIN SODIUM-DEXTROSE 2-3 GM-%(50ML) IV SOLR
INTRAVENOUS | Status: AC
  Filled 2023-09-20: qty 50

## 2023-09-20 SURGICAL SUPPLY — 30 items
BENZOIN TINCTURE PRP APPL 2/3 (GAUZE/BANDAGES/DRESSINGS) ×1 IMPLANT
BLADE OSC/SAGITTAL MD 9X18.5 (BLADE) IMPLANT
BLADE SURG 15 STRL LF DISP TIS (BLADE) IMPLANT
BNDG COHESIVE 4X5 TAN STRL LF (GAUZE/BANDAGES/DRESSINGS) ×1 IMPLANT
BNDG ELASTIC 4INX 5YD STR LF (GAUZE/BANDAGES/DRESSINGS) ×1 IMPLANT
BNDG ESMARCH 4X12 STRL LF (GAUZE/BANDAGES/DRESSINGS) ×1 IMPLANT
BNDG GAUZE DERMACEA FLUFF 4 (GAUZE/BANDAGES/DRESSINGS) ×1 IMPLANT
BNDG STRETCH 4X75 STRL LF (GAUZE/BANDAGES/DRESSINGS) ×1 IMPLANT
CANISTER SUCT 1200ML W/VALVE (MISCELLANEOUS) ×1 IMPLANT
COVER LIGHT HANDLE UNIVERSAL (MISCELLANEOUS) ×2 IMPLANT
DRAPE FLUOR MINI C-ARM 54X84 (DRAPES) ×1 IMPLANT
DURAPREP 26ML APPLICATOR (WOUND CARE) ×1 IMPLANT
ELECT REM PT RETURN 9FT ADLT (ELECTROSURGICAL) ×1
ELECTRODE REM PT RTRN 9FT ADLT (ELECTROSURGICAL) ×1 IMPLANT
GAUZE SPONGE 4X4 12PLY STRL (GAUZE/BANDAGES/DRESSINGS) ×1 IMPLANT
GAUZE XEROFORM 1X8 LF (GAUZE/BANDAGES/DRESSINGS) ×1 IMPLANT
GLOVE BIOGEL PI IND STRL 8 (GLOVE) ×1 IMPLANT
GLOVE SURG SS PI 7.5 STRL IVOR (GLOVE) ×1 IMPLANT
GOWN SPEC L4 XLG W/TWL (GOWN DISPOSABLE) ×1 IMPLANT
GOWN STRL REUS W/ TWL LRG LVL3 (GOWN DISPOSABLE) ×1 IMPLANT
KIT TURNOVER KIT A (KITS) ×1 IMPLANT
NS IRRIG 500ML POUR BTL (IV SOLUTION) ×1 IMPLANT
PACK EXTREMITY ARMC (MISCELLANEOUS) ×1 IMPLANT
RASP SM TEAR CROSS CUT (RASP) IMPLANT
STOCKINETTE IMPERVIOUS LG (DRAPES) ×1 IMPLANT
STRIP CLOSURE SKIN 1/4X4 (GAUZE/BANDAGES/DRESSINGS) ×1 IMPLANT
SUT MNCRL 4-0 27XMFL (SUTURE) ×1
SUT VIC AB 3-0 SH 27X BRD (SUTURE) IMPLANT
SUT VIC AB 4-0 FS2 27 (SUTURE) IMPLANT
SUTURE MNCRL 4-0 27XMF (SUTURE) IMPLANT

## 2023-09-20 NOTE — Op Note (Signed)
 Operative note   Surgeon:Lacie Landry [employer]/support/administrative Officer: None    Preop diagnosis: Hallux limitus right great toe joint    Postop diagnosis: Same    Procedure: 1.  Cheilectomy right great toe joint 2.  Intraoperative fluoroscopy use without assistance of radiologist    EBL: Minimal    Anesthesia:local and general local consists of a total of 14 cc of 0.25% plain bupivacaine  and 10 cc of Exparel  long-acting anesthetic    Hemostasis: Ankle tourniquet inflated to 200 mmHg for approximately 30 minutes    Specimen: None    Complications: None    Operative indications:Jill Robbins is an 63 y.o. that presents today for surgical intervention.  The risks/benefits/alternatives/complications have been discussed and consent has been given.    Procedure:  Patient was brought into the OR and placed on the operating table in thesupine position. After anesthesia was obtained theright lower extremity was prepped and draped in usual sterile fashion.  Attention was directed to the dorsal aspect of the right first MTPJ where an incision was performed over the joint.  Sharp and blunt dissection carried down to the capsule.  A longitudinal capsulotomy was then performed.  Thick viscous synovitis was noted.  At this time the joint was evaluated and freed.  Exposing the head of the metatarsal and base of the proximal phalanx.  A large rim of periosteal bone bar was noted around the dorsal first metatarsal and removed with a power saw.  The dorsal 1/3-1/4 of the first metatarsal head noted erosion of cartilage with exposed subchondral bone plate.  The base of the proximal phalanx was evaluated and a small rim of periosteal bone spur was noted.  This was removed without rongeur.  The dorsal 25% of the proximal phalanx had noted exposed subchondral bone plate without cartilage.  At this time further contouring of the MTPJ was then performed.  Remove the dorsal 25% of the first metatarsal and dorsal 25% of the base of the  proximal phalanx.  Good range of motion was noted.  Good reduction of the spurring was noted with intraoperative fluoroscopy use with both pre and post excision of bone fluoroscopy actors produced.  The wound was then flushed with copious amounts of irrigation.  Synovitis was excised from the metatarsophalangeal joint.  Closure was then performed with a 3-0 Vicryl the capsule.  4-0 Vicryl with subcutaneous tissue and a 4-0 Monocryl for the skin.  A bulky sterile dressing was applied.  She will be placed in a flat postoperative shoe.  Weightbearing as tolerated.    Patient tolerated the procedure and anesthesia well.  Was transported from the OR to the PACU with all vital signs stable and vascular status intact. To be discharged per routine protocol.  Will follow up in approximately 1 week in the outpatient clinic.

## 2023-09-20 NOTE — Anesthesia Procedure Notes (Signed)
 Procedure Name: LMA Insertion Date/Time: 09/20/2023 12:27 PM  Performed by: Jaylene Nest, CRNAPre-anesthesia Checklist: Patient identified, Patient being monitored, Timeout performed, Emergency Drugs available and Suction available Patient Re-evaluated:Patient Re-evaluated prior to induction Oxygen Delivery Method: Circle system utilized Preoxygenation: Pre-oxygenation with 100% oxygen Induction Type: IV induction Ventilation: Mask ventilation without difficulty LMA: LMA inserted LMA Size: 4.0 Tube type: Oral Number of attempts: 1 Placement Confirmation: positive ETCO2 and breath sounds checked- equal and bilateral Tube secured with: Tape Dental Injury: Teeth and Oropharynx as per pre-operative assessment

## 2023-09-20 NOTE — H&P (Signed)
 HISTORY AND PHYSICAL INTERVAL NOTE:  09/20/2023  12:02 PM  Jill Robbins  has presented today for surgery, with the diagnosis of M20.21 - Hallux rigidus of rright foot M79.671 - Right foot pain.  The various methods of treatment have been discussed with the patient.  No guarantees were given.  After consideration of risks, benefits and other options for treatment, the patient has consented to surgery.  I have reviewed the patients' chart and labs.     Robbins history and physical examination was performed in my office.  The patient was reexamined.  There have been no changes to this history and physical examination.  Jill Robbins

## 2023-09-20 NOTE — Transfer of Care (Signed)
 Immediate Anesthesia Transfer of Care Note  Patient: Jill Robbins  Procedure(s) Performed: CHEILECTOMY (Right: Foot)  Patient Location: PACU  Anesthesia Type: General  Level of Consciousness: awake, alert  and patient cooperative  Airway and Oxygen Therapy: Patient Spontanous Breathing and Patient connected to supplemental oxygen  Post-op Assessment: Post-op Vital signs reviewed, Patient's Cardiovascular Status Stable, Respiratory Function Stable, Patent Airway and No signs of Nausea or vomiting  Post-op Vital Signs: Reviewed and stable  Complications: No notable events documented.

## 2023-09-21 NOTE — Anesthesia Postprocedure Evaluation (Signed)
 Anesthesia Post Note  Patient: Jill Robbins  Procedure(s) Performed: CHEILECTOMY (Right: Foot)  Patient location during evaluation: PACU Anesthesia Type: General Level of consciousness: awake and alert Pain management: pain level controlled Vital Signs Assessment: post-procedure vital signs reviewed and stable Respiratory status: spontaneous breathing, nonlabored ventilation, respiratory function stable and patient connected to nasal cannula oxygen Cardiovascular status: blood pressure returned to baseline and stable Postop Assessment: no apparent nausea or vomiting Anesthetic complications: no   No notable events documented.   Last Vitals:  Vitals:   09/20/23 1330 09/20/23 1345  BP: 111/75 108/71  Pulse: 62 60  Resp: 14 18  Temp:    SpO2: 99% 99%    Last Pain:  Vitals:   09/21/23 1052  TempSrc:   PainSc: 0-No pain                 [name] C Chauntay Paszkiewicz

## 2023-09-22 ENCOUNTER — Encounter: Payer: Self-pay | Admitting: Podiatry

## 2023-10-04 ENCOUNTER — Other Ambulatory Visit: Payer: Self-pay | Admitting: Family Medicine

## 2023-10-04 DIAGNOSIS — I10 Essential (primary) hypertension: Secondary | ICD-10-CM

## 2023-10-04 MED ORDER — TRIAMTERENE-HCTZ 37.5-25 MG PO TABS: 1.0000 | ORAL_TABLET | Freq: Every day | ORAL | 0 refills | Status: DC

## 2023-10-04 NOTE — Telephone Encounter (Signed)
Requested Prescriptions  Pending Prescriptions Disp Refills   triamterene-hydrochlorothiazide (MAXZIDE-25) 37.5-25 MG tablet 90 tablet 0    Sig: Take 1 tablet by mouth daily.     Cardiovascular: Diuretic Combos Passed - 10/04/2023  1:07 PM      Passed - K in normal range and within 180 days    Potassium  Date Value Ref Range Status  05/09/2023 3.6 3.5 - 5.2 mmol/L Final         Passed - Na in normal range and within 180 days    Sodium  Date Value Ref Range Status  05/09/2023 138 134 - 144 mmol/L Final         Passed - Cr in normal range and within 180 days    Creat  Date Value Ref Range Status  06/23/2017 0.73 0.50 - 1.05 mg/dL Final    Comment:    For patients >64 years of age, the reference limit for Creatinine is approximately 13% higher for people identified as African-American. .    Creatinine, Ser  Date Value Ref Range Status  05/09/2023 0.74 0.57 - 1.00 mg/dL Final         Passed - Last BP in normal range    BP Readings from Last 1 Encounters:  09/20/23 108/71         Passed - Valid encounter within last 6 months    Recent Outpatient Visits           4 months ago Contact dermatitis due to poison sumac   [facility] [place], [place], PA-C   4 months ago Essential hypertension   [facility], Marzella Schlein, MD   11 months ago Encounter for annual physical exam   [facility] [place], Marzella Schlein, MD   1 year ago Essential hypertension   [facility] Caro Laroche, DO   1 year ago Acute frontal sinusitis, recurrence not specified   [facility] Caro Laroche, DO       Future Appointments             In 1 month Bacigalupo, Marzella Schlein, MD [facility], [facility]

## 2023-10-04 NOTE — Telephone Encounter (Signed)
Medication Refill -  Most Recent Primary Care Visit:  Provider: Debera Lat  Department: [facility]  Visit Type: OFFICE VISIT  Date: 05/23/2023  Medication: triamterene-hydrochlorothiazide (MAXZIDE-25) 37.5-25 MG tablet [[id number]]   Has the patient contacted their pharmacy? Yes  (Agent: If yes, when and what did the pharmacy advise?) Contact office   Is this the correct pharmacy for this prescription? Yes  This is the patient's preferred pharmacy:    [id number] [place], [place] - [address] AT [facility] OF [address] [place] [address] Phone: [phone] Fax: [phone]   Has the prescription been filled recently? Yes  Is the patient out of the medication? No  Has the patient been seen for an appointment in the last year OR does the patient have an upcoming appointment? Yes  Can we respond through MyChart? Yes  Agent: Please be advised that Rx refills may take up to 3 business days. We ask that you follow-up with your pharmacy.

## 2023-11-13 ENCOUNTER — Encounter: Payer: Self-pay | Admitting: Family Medicine

## 2023-11-13 ENCOUNTER — Ambulatory Visit: Admitting: Family Medicine

## 2023-11-13 VITALS — BP 124/80 | HR 77 | Ht 69.5 in | Wt 148.9 lb

## 2023-11-13 DIAGNOSIS — F419 Anxiety disorder, unspecified: Secondary | ICD-10-CM | POA: Diagnosis not present

## 2023-11-13 DIAGNOSIS — L237 Allergic contact dermatitis due to plants, except food: Secondary | ICD-10-CM | POA: Diagnosis not present

## 2023-11-13 DIAGNOSIS — Z0001 Encounter for general adult medical examination with abnormal findings: Secondary | ICD-10-CM

## 2023-11-13 DIAGNOSIS — I10 Essential (primary) hypertension: Secondary | ICD-10-CM

## 2023-11-13 DIAGNOSIS — Z0000 Encounter for general adult medical examination without abnormal findings: Secondary | ICD-10-CM

## 2023-11-13 MED ORDER — LEVOCETIRIZINE DIHYDROCHLORIDE 5 MG PO TABS
5.0000 mg | ORAL_TABLET | Freq: Every evening | ORAL | 3 refills | Status: DC
Start: 2023-11-13 — End: 2024-05-16

## 2023-11-13 MED ORDER — TRIAMTERENE-HCTZ 37.5-25 MG PO TABS
1.0000 | ORAL_TABLET | Freq: Every day | ORAL | 1 refills | Status: DC
Start: 2023-11-13 — End: 2024-01-03

## 2023-11-13 NOTE — Assessment & Plan Note (Signed)
 Hypertension is well-controlled with triamterene HCTZ. Blood pressure today was 124/80 mmHg. Issues with pharmacy refills have been addressed. Discussed benefits of maintaining current medication regimen and importance of consistent adherence. Patient prefers using [facility] for 90-day supply. - Send prescription for triamterene HCTZ to [facility] - Follow up in six months

## 2023-11-13 NOTE — Progress Notes (Signed)
 Complete physical exam   Patient: Jill Robbins   DOB: 12-Jul-1961   63 y.o. Female  MRN: [id number] Visit Date: 11/13/2023  Today's healthcare provider: Shirlee Latch, MD   Chief Complaint  Patient presents with   Annual Exam    Last completed 11/07/22 Diet -  Trying not to consume sugar,red meat or alcohol Exercise - none due to foot surgery on January 8,2025 Feeling - well Sleeping - poorly due to menopaurse, waking up around 2:30-3 and having a hard time going back to sleep Concerns - medication refills   Subjective    ROSHANDA Robbins is a 63 y.o. female who presents today for a complete physical exam.    Discussed the use of AI scribe software for clinical note transcription with the patient, who gave verbal consent to proceed.  History of Present Illness   The patient, with a history of hypertension and allergies, presents for a routine physical and medication refills. She has been experiencing issues with her pharmacy not providing the correct amount of medication, specifically Buspar. The patient also mentions having foot surgery recently which has limited her ability to exercise. She has noticed a slight increase in her blood sugar levels, which she attributes to her decreased physical activity. The patient is also on Veozah, a medication that can potentially harm the liver, and has been having regular blood panels done by her GYN to monitor this.        Last depression screening scores    11/13/2023   10:37 AM 05/09/2023    2:21 PM 11/07/2022   10:41 AM  PHQ 2/9 Scores  PHQ - 2 Score 0 2 1  PHQ- 9 Score  5 5   Last fall risk screening    11/13/2023   10:37 AM  Fall Risk   Falls in the past year? 0  Number falls in past yr: 0  Injury with Fall? 0  Risk for fall due to : No Fall Risks  Follow up Falls evaluation completed        Medications: Outpatient Medications Prior to Visit  Medication Sig   busPIRone (BUSPAR) 7.5 MG tablet Take 1 tablet (7.5 mg  total) by mouth 2 (two) times daily.   Calcium Carb-Cholecalciferol (CALCIUM-VITAMIN D) 500-200 MG-UNIT tablet Take 2 tablets by mouth daily.    doxycycline (ORACEA) 40 MG capsule Take 40 mg by mouth daily.   Fezolinetant (VEOZAH) 45 MG TABS Take by mouth daily.   GLUCOSAMINE-CHONDROITIN PO Take 2 tablets by mouth daily.   Multiple Vitamin (MULTIVITAMIN WITH MINERALS) TABS tablet Take 2 tablets by mouth daily.   Omega-3 Fatty Acids (FISH OIL PO) Take 2 capsules by mouth daily.   oxyCODONE-acetaminophen (PERCOCET) 5-325 MG tablet Take 1-2 tablets by mouth every 6 (six) hours as needed for severe pain (pain score 7-10). Max 6 tabs per day (Patient not taking: Reported on 11/13/2023)   Perfluorohexyloctane (MIEBO) 1.338 GM/ML SOLN    [DISCONTINUED] levocetirizine (XYZAL) 5 MG tablet TAKE 1 TABLET(5 MG) BY MOUTH EVERY EVENING   [DISCONTINUED] triamcinolone ointment (KENALOG) 0.5 % Apply 1 Application topically 2 (two) times daily. (Patient not taking: Reported on 08/31/2023)   [DISCONTINUED] triamterene-hydrochlorothiazide (MAXZIDE-25) 37.5-25 MG tablet Take 1 tablet by mouth daily.   No facility-administered medications prior to visit.    Review of Systems    Objective    BP 124/80 (BP Location: Right Arm, Patient Position: Sitting, Cuff Size: Normal)   Pulse 77   Ht 5'  9.5" (1.765 m)   Wt 148 lb 14.4 oz (67.5 kg)   SpO2 98%   BMI 21.67 kg/m    Physical Exam Vitals reviewed.  Constitutional:      General: She is not in acute distress.    Appearance: Normal appearance. She is well-developed. She is not diaphoretic.  HENT:     Head: Normocephalic and atraumatic.     Right Ear: Tympanic membrane, ear canal and external ear normal.     Left Ear: Tympanic membrane, ear canal and external ear normal.     Nose: Nose normal.     Mouth/Throat:     Mouth: Mucous membranes are moist.     Pharynx: Oropharynx is clear. No oropharyngeal exudate.  Eyes:     General: No scleral icterus.     Conjunctiva/sclera: Conjunctivae normal.     Pupils: Pupils are equal, round, and reactive to light.  Neck:     Thyroid: No thyromegaly.  Cardiovascular:     Rate and Rhythm: Normal rate and regular rhythm.     Heart sounds: Normal heart sounds. No murmur heard. Pulmonary:     Effort: Pulmonary effort is normal. No respiratory distress.     Breath sounds: Normal breath sounds. No wheezing or rales.  Abdominal:     General: There is no distension.     Palpations: Abdomen is soft.     Tenderness: There is no abdominal tenderness.  Musculoskeletal:        General: No deformity.     Cervical back: Neck supple.     Right lower leg: No edema.     Left lower leg: No edema.  Lymphadenopathy:     Cervical: No cervical adenopathy.  Skin:    General: Skin is warm and dry.     Findings: No rash.  Neurological:     Mental Status: She is alert and oriented to person, place, and time. Mental status is at baseline.     Gait: Gait normal.  Psychiatric:        Mood and Affect: Mood normal.        Behavior: Behavior normal.        Thought Content: Thought content normal.      No results found for any visits on 11/13/23.  Assessment & Plan    Routine Health Maintenance and Physical Exam  Exercise Activities and Dietary recommendations  Goals   None     Immunization History  Administered Date(s) Administered   Influenza,inj,Quad PF,6+ Mos 07/09/2014   Influenza-Unspecified 06/29/2018   PFIZER(Purple Top)SARS-COV-2 Vaccination 11/07/2019, 12/05/2019   Pfizer Covid-19 Vaccine Bivalent Booster 7yrs & up 09/03/2020   Tdap 06/21/2013, 05/09/2023   Zoster Recombinant(Shingrix) 08/25/2017, 11/24/2017    Health Maintenance  Topic Date Due   Cervical Cancer Screening (HPV/Pap Cotest)  05/15/2021   COVID-19 Vaccine (4 - 2024-25 season) 05/14/2023   INFLUENZA VACCINE  12/11/2023 (Originally 04/13/2023)   Colonoscopy  08/01/2024   MAMMOGRAM  06/26/2025   DTaP/Tdap/Td (3 - Td or Tdap)  05/08/2033   Hepatitis C Screening  Completed   HIV Screening  Completed   Zoster Vaccines- Shingrix  Completed   HPV VACCINES  Aged Out    Discussed health benefits of physical activity, and encouraged her to engage in regular exercise appropriate for her age and condition.  Problem List Items Addressed This Visit       Cardiovascular and Mediastinum   Essential hypertension   Hypertension is well-controlled with triamterene HCTZ. Blood pressure today was  124/80 mmHg. Issues with pharmacy refills have been addressed. Discussed benefits of maintaining current medication regimen and importance of consistent adherence. Patient prefers using [facility] for 90-day supply. - Send prescription for triamterene HCTZ to [facility] - Follow up in six months      Relevant Medications   triamterene-hydrochlorothiazide (MAXZIDE-25) 37.5-25 MG tablet   Other Relevant Orders   Lipid panel     Other   Anxiety   Chronic and well controlled Continue buspar at current dose      Relevant Orders   CBC w/Diff/Platelet   TSH   Other Visit Diagnoses       Encounter for annual physical exam    -  Primary   Relevant Orders   Lipid panel   CBC w/Diff/Platelet   TSH     Contact dermatitis due to poison sumac       Relevant Medications   levocetirizine (XYZAL) 5 MG tablet          Medication Management Requires refills for allergy medication and Buspar. Issue with Buspar prescription resolved by nurse. - Send prescription for allergy medication to [facility] - Ensure Buspar prescription is correctly filled and monitored  General Health Maintenance Due for routine screenings and vaccinations. Completed mammogram in October; up to date with shingles and tetanus vaccines (next due in 2034). Discussed importance of regular screenings and updating records. - Obtain Pap smear report from [facility] - Obtain 2020 colonoscopy report from [facility] - Order  cholesterol panel, CBC, and thyroid function tests - Update records with recent liver function tests from [facility] monitoring - Remind patient to schedule annual mammogram in October  Follow-up - Schedule follow-up appointment in six months - Review lab results when available - Update patient on colonoscopy schedule based on 2020 report.       Return in about 6 months (around 05/15/2024) for chronic disease f/u.     Jill Latch, MD  [facility] [phone] (phone) [phone] (fax)  [facility]

## 2023-11-13 NOTE — Assessment & Plan Note (Signed)
Chronic and well controlled Continue buspar at current dose 

## 2023-11-14 ENCOUNTER — Encounter: Payer: Self-pay | Admitting: Family Medicine

## 2023-11-14 LAB — CBC WITH DIFFERENTIAL/PLATELET
Basophils Absolute: 0.1 10*3/uL (ref 0.0–0.2)
Basos: 1 %
EOS (ABSOLUTE): 1.1 10*3/uL — ABNORMAL HIGH (ref 0.0–0.4)
Eos: 16 %
Hematocrit: 44.4 % (ref 34.0–46.6)
Hemoglobin: 14.8 g/dL (ref 11.1–15.9)
Immature Grans (Abs): 0 10*3/uL (ref 0.0–0.1)
Immature Granulocytes: 0 %
Lymphocytes Absolute: 2.3 10*3/uL (ref 0.7–3.1)
Lymphs: 33 %
MCH: 31.6 pg (ref 26.6–33.0)
MCHC: 33.3 g/dL (ref 31.5–35.7)
MCV: 95 fL (ref 79–97)
Monocytes Absolute: 0.6 10*3/uL (ref 0.1–0.9)
Monocytes: 8 %
Neutrophils Absolute: 2.8 10*3/uL (ref 1.4–7.0)
Neutrophils: 42 %
Platelets: 284 10*3/uL (ref 150–450)
RBC: 4.68 x10E6/uL (ref 3.77–5.28)
RDW: 12.7 % (ref 11.7–15.4)
WBC: 6.8 10*3/uL (ref 3.4–10.8)

## 2023-11-14 LAB — TSH: TSH: 1.44 u[IU]/mL (ref 0.450–4.500)

## 2023-11-14 LAB — LIPID PANEL
Chol/HDL Ratio: 2.9 ratio (ref 0.0–4.4)
Cholesterol, Total: 211 mg/dL — ABNORMAL HIGH (ref 100–199)
HDL: 74 mg/dL (ref >39)
LDL Chol Calc (NIH): 118 mg/dL — ABNORMAL HIGH (ref 0–99)
Triglycerides: 106 mg/dL (ref 0–149)
VLDL Cholesterol Cal: 19 mg/dL (ref 5–40)

## 2023-11-16 ENCOUNTER — Other Ambulatory Visit: Payer: Self-pay | Admitting: Family Medicine

## 2023-11-16 DIAGNOSIS — L237 Allergic contact dermatitis due to plants, except food: Secondary | ICD-10-CM

## 2024-01-01 ENCOUNTER — Telehealth: Payer: Self-pay | Admitting: Family Medicine

## 2024-01-01 DIAGNOSIS — F419 Anxiety disorder, unspecified: Secondary | ICD-10-CM

## 2024-01-01 NOTE — Telephone Encounter (Signed)
 [facility] faxed refill request for the following medications:   busPIRone  (BUSPAR ) 7.5 MG tablet    Please advise.

## 2024-01-02 ENCOUNTER — Other Ambulatory Visit: Payer: Self-pay | Admitting: Family Medicine

## 2024-01-02 DIAGNOSIS — I10 Essential (primary) hypertension: Secondary | ICD-10-CM

## 2024-01-03 ENCOUNTER — Telehealth: Payer: Self-pay | Admitting: Family Medicine

## 2024-01-03 DIAGNOSIS — F419 Anxiety disorder, unspecified: Secondary | ICD-10-CM

## 2024-01-03 MED ORDER — TRIAMTERENE-HCTZ 37.5-25 MG PO TABS: 1.0000 | ORAL_TABLET | Freq: Every day | ORAL | 1 refills | Status: DC

## 2024-01-03 NOTE — Telephone Encounter (Signed)
 [facility] is requesting refill busPIRone  (BUSPAR ) 7.5 MG tablet   Please advise

## 2024-01-03 NOTE — Telephone Encounter (Signed)
 Request refused: Patient has requested refill too soon (LRF 05/09/23 #180 [id number] (1 year supply))

## 2024-01-03 NOTE — Telephone Encounter (Signed)
 Refused Maxzide because it's being requested too soon.

## 2024-01-04 MED ORDER — BUSPIRONE HCL 7.5 MG PO TABS
7.5000 mg | ORAL_TABLET | Freq: Two times a day (BID) | ORAL | 1 refills | Status: DC
Start: 2024-01-04 — End: 2024-04-08

## 2024-01-04 NOTE — Telephone Encounter (Signed)
 Contacted patient to verify which pharmacy rx should be sent to as we have received request from both and she reports requesting [facility] to cancel her prescription as she would like to use [facility]. Rx sent for #90 1 rf

## 2024-01-04 NOTE — Telephone Encounter (Signed)
 [facility] sent another request for busPIRone  (BUSPAR ) 7.5 MG tablet.  The last refill was sent to [facility].  This request is for mail order.  If not appropriate send denial to [facility].  Please advise

## 2024-01-04 NOTE — Addendum Note (Signed)
 Addended by: [place] on: 01/04/2024 04:37 PM   Modules accepted: Orders

## 2024-03-14 ENCOUNTER — Other Ambulatory Visit: Payer: Self-pay | Admitting: Family Medicine

## 2024-03-14 DIAGNOSIS — F419 Anxiety disorder, unspecified: Secondary | ICD-10-CM

## 2024-03-25 ENCOUNTER — Other Ambulatory Visit: Payer: Self-pay | Admitting: Obstetrics and Gynecology

## 2024-03-25 DIAGNOSIS — Z1231 Encounter for screening mammogram for malignant neoplasm of breast: Secondary | ICD-10-CM

## 2024-04-08 ENCOUNTER — Other Ambulatory Visit: Payer: Self-pay | Admitting: Family Medicine

## 2024-04-08 DIAGNOSIS — F419 Anxiety disorder, unspecified: Secondary | ICD-10-CM

## 2024-04-08 NOTE — Telephone Encounter (Unsigned)
 Copied from CRM [id number]. Topic: Clinical - Medication Refill >> Apr 08, 2024  9:33 AM Antwanette L wrote: Medication: busPIRone  (BUSPAR ) 7.5 MG tablet   Has the patient contacted their pharmacy? Yes   This is the patient's preferred pharmacy:  [facility] - [place], [place] - [address] [place] [address] Phone: [phone] Fax: [phone]   Is this the correct pharmacy for this prescription? Yes   Has the prescription been filled recently? Yes. Last refill was on 01/04/24  Is the patient out of the medication? No  Has the patient been seen for an appointment in the last year OR does the patient have an upcoming appointment? Yes. Last ov with Dr. Myrla was on 11/13/23  Can we respond through MyChart? No. Contact the patient by phone at [phone]  Agent: Please be advised that Rx refills may take up to 3 business days. We ask that you follow-up with your pharmacy.

## 2024-04-09 MED ORDER — BUSPIRONE HCL 7.5 MG PO TABS
7.5000 mg | ORAL_TABLET | Freq: Two times a day (BID) | ORAL | 0 refills | Status: DC
Start: 2024-04-09 — End: 2024-05-16

## 2024-04-09 NOTE — Telephone Encounter (Signed)
 Requested Prescriptions  Pending Prescriptions Disp Refills   busPIRone  (BUSPAR ) 7.5 MG tablet 180 tablet 0    Sig: Take 1 tablet (7.5 mg total) by mouth 2 (two) times daily.     Psychiatry: Anxiolytics/Hypnotics - Non-controlled Passed - 04/09/2024  2:02 PM      Passed - Valid encounter within last 12 months    Recent Outpatient Visits           4 months ago Encounter for annual physical exam   [place] [facility] [place], Jon HERO, MD       Future Appointments             In 1 month Bacigalupo, Jon HERO, MD [facility]

## 2024-05-16 ENCOUNTER — Encounter: Payer: Self-pay | Admitting: Family Medicine

## 2024-05-16 ENCOUNTER — Ambulatory Visit: Admitting: Family Medicine

## 2024-05-16 VITALS — BP 120/78 | HR 64 | Ht 69.5 in | Wt 149.5 lb

## 2024-05-16 DIAGNOSIS — Z23 Encounter for immunization: Secondary | ICD-10-CM | POA: Diagnosis not present

## 2024-05-16 DIAGNOSIS — I10 Essential (primary) hypertension: Secondary | ICD-10-CM | POA: Diagnosis not present

## 2024-05-16 DIAGNOSIS — J301 Allergic rhinitis due to pollen: Secondary | ICD-10-CM

## 2024-05-16 DIAGNOSIS — F419 Anxiety disorder, unspecified: Secondary | ICD-10-CM | POA: Diagnosis not present

## 2024-05-16 DIAGNOSIS — F5104 Psychophysiologic insomnia: Secondary | ICD-10-CM | POA: Diagnosis not present

## 2024-05-16 MED ORDER — FEXOFENADINE HCL 180 MG PO TABS: 180.0000 mg | ORAL_TABLET | Freq: Every day | ORAL | 3 refills | Status: AC

## 2024-05-16 MED ORDER — LEVOCETIRIZINE DIHYDROCHLORIDE 5 MG PO TABS: 5.0000 mg | ORAL_TABLET | Freq: Every evening | ORAL | 3 refills | Status: DC

## 2024-05-16 MED ORDER — BUSPIRONE HCL 7.5 MG PO TABS: 7.5000 mg | ORAL_TABLET | Freq: Two times a day (BID) | ORAL | 3 refills | Status: AC

## 2024-05-16 MED ORDER — TRIAMTERENE-HCTZ 37.5-25 MG PO TABS: 1.0000 | ORAL_TABLET | Freq: Every day | ORAL | 1 refills | Status: AC

## 2024-05-16 NOTE — Assessment & Plan Note (Signed)
 Hypertension is well-controlled on triamterene -HCTZ 37.5-25 mg daily with a blood pressure reading of 120/78. - Continue triamterene -HCTZ 37.5-25 mg daily. - Recheck blood pressure at next visit.

## 2024-05-16 NOTE — Assessment & Plan Note (Signed)
 Allergic rhinitis previously managed with levocetirizine, now with decreased effectiveness requiring additional medication at night. - Switch from levocetirizine to Allegra  once daily. - Cancel levocetirizine order with [facility] and ensure only Allegra  is sent.

## 2024-05-16 NOTE — Assessment & Plan Note (Signed)
 Insomnia likely related to caregiver stress and menopause, with reports of waking at 2:30 or 3:00 AM and inability to return to sleep. Good sleep hygiene practices are in place. - Recommend magnesium glycinate 400-600 mg, 30 minutes before bedtime to aid sleep. - Check potassium levels today due to blood pressure medication.

## 2024-05-16 NOTE — Assessment & Plan Note (Signed)
 Anxiety disorder is managed with Buspar  7.5 mg BID with no new concerns or changes in symptoms. - Continue Buspar  7.5 mg BID. - Refill medications through [facility].

## 2024-05-16 NOTE — Progress Notes (Addendum)
 Established patient visit   Patient: Jill Robbins   DOB: 1960/10/13   63 y.o. Female  MRN: [id number] Visit Date: 05/16/2024  Today's healthcare provider: Jon Eva, MD   Chief Complaint  Patient presents with   Medical Management of Chronic Issues   Hypertension    She reports excellent compliance with treatment. She is not having side effects.  She is following a Low Sodium diet. She is exercising. She does not smoke. Use of agents associated with hypertension: NSAIDS.  Symptoms: chest pain X 1 month ago and lower extremity edema   Anxiety    She reports excellent compliance with treatment. She reports good tolerance of treatment. She is not having side effects.  She feels her anxiety is severe and Worse since last visit. Symptoms: chest pain, fatigue, insomnia, irritable, racing thoughts   Medication Consultation    Reports she is having to take an additional allergy pill at night and wondering if she should take something else   Subjective    HPI HPI     Hypertension    Additional comments: She reports excellent compliance with treatment. She is not having side effects.  She is following a Low Sodium diet. She is exercising. She does not smoke. Use of agents associated with hypertension: NSAIDS.  Symptoms: chest pain X 1 month ago and lower extremity edema        Anxiety    Additional comments: She reports excellent compliance with treatment. She reports good tolerance of treatment. She is not having side effects.  She feels her anxiety is severe and Worse since last visit. Symptoms: chest pain, fatigue, insomnia, irritable, racing thoughts        Medication Consultation    Additional comments: Reports she is having to take an additional allergy pill at night and wondering if she should take something else      Last edited by Lilian Fitzpatrick, CMA on 05/16/2024 11:07 AM.       Discussed the use of AI scribe software for clinical note  transcription with the patient, who gave verbal consent to proceed.  History of Present Illness   Jill Robbins is a 63 year old female who presents for chronic follow-up.  Hypertension is managed with triamterene -HCTZ 37.5-25 mg daily. Anxiety is managed with Buspar  7.5 mg twice daily, with no new concerns.  She experiences sleep disturbances, waking around 2:30 or 3:00 AM and unable to return to sleep, possibly due to caregiving responsibilities and menopause. She maintains good sleep hygiene.  She questions the efficacy of levocetirizine, as it does not last 24 hours, and takes an additional antihistamine at night. 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          11/13/2023   10:37 AM 05/09/2023    2:21 PM 11/07/2022   10:41 AM 06/14/2022   10:29 AM 03/08/2022    1:13 PM  Depression screen PHQ 2/9  Decreased Interest 0 1 0 0 0  Down, Depressed, Hopeless 0 1 1 0 0  PHQ - 2 Score 0 2 1 0 0  Altered sleeping  1 2 1 2   Tired, decreased energy  1 1 1  0  Change in appetite  1 0 0 0  Feeling bad or failure about yourself   0 0 0 0  Trouble concentrating  0 0 0 0  Moving slowly or fidgety/restless  0 1 0 0  Suicidal thoughts  0 0  0 0  PHQ-9 Score  5 5 2 2   Difficult doing work/chores  Not difficult at all Somewhat difficult Not difficult at all Not difficult at all       11/13/2023   10:38 AM 05/09/2023    2:22 PM 06/14/2022   10:22 AM 06/29/2021    8:33 AM  GAD 7 : Generalized Anxiety Score  Nervous, Anxious, on Edge 2 2 1  0  Control/stop worrying 2 3 3  0  Worry too much - different things 2 3 3  0  Trouble relaxing 2 2 1  0  Restless 2 0 0 1  Easily annoyed or irritable 2 1 0 0  Afraid - awful might happen 0 0 0 0  Total GAD 7 Score 12 11 8 1   Anxiety Difficulty Somewhat difficult Not difficult at all Not difficult at all Not difficult at all      Medications: Outpatient Medications Prior to Visit  Medication Sig   Fezolinetant (VEOZAH) 45 MG  TABS Take by mouth daily.   GLUCOSAMINE-CHONDROITIN PO Take 2 tablets by mouth daily.   Multiple Vitamin (MULTIVITAMIN WITH MINERALS) TABS tablet Take 2 tablets by mouth daily.   Omega-3 Fatty Acids (FISH OIL PO) Take 2 capsules by mouth daily.   Perfluorohexyloctane (MIEBO) 1.338 GM/ML SOLN    [DISCONTINUED] busPIRone  (BUSPAR ) 7.5 MG tablet Take 1 tablet (7.5 mg total) by mouth 2 (two) times daily.   [DISCONTINUED] levocetirizine (XYZAL ) 5 MG tablet Take 1 tablet (5 mg total) by mouth every evening. TAKE 1 TABLET(5 MG) BY MOUTH EVERY EVENING   [DISCONTINUED] triamterene -hydrochlorothiazide (MAXZIDE-25) 37.5-25 MG tablet Take 1 tablet by mouth daily.   [DISCONTINUED] Calcium Carb-Cholecalciferol (CALCIUM-VITAMIN D) 500-200 MG-UNIT tablet Take 2 tablets by mouth daily.  (Patient not taking: Reported on 05/16/2024)   [DISCONTINUED] doxycycline  (ORACEA ) 40 MG capsule Take 40 mg by mouth daily. (Patient not taking: Reported on 05/16/2024)   No facility-administered medications prior to visit.    Review of Systems     Objective    BP 120/78 (BP Location: Left Arm, Patient Position: Sitting, Cuff Size: Normal)   Pulse 64   Ht 5' 9.5 (1.765 m)   Wt 149 lb 8 oz (67.8 kg)   SpO2 99%   BMI 21.76 kg/m    Physical Exam Vitals reviewed.  Constitutional:      General: She is not in acute distress.    Appearance: Normal appearance. She is well-developed. She is not diaphoretic.  HENT:     Head: Normocephalic and atraumatic.  Eyes:     General: No scleral icterus.    Conjunctiva/sclera: Conjunctivae normal.  Neck:     Thyroid: No thyromegaly.  Cardiovascular:     Rate and Rhythm: Normal rate and regular rhythm.     Heart sounds: Normal heart sounds. No murmur heard. Pulmonary:     Effort: Pulmonary effort is normal. No respiratory distress.     Breath sounds: Normal breath sounds. No wheezing, rhonchi or rales.  Musculoskeletal:     Cervical back: Neck supple.     Right lower leg: No  edema.     Left lower leg: No edema.  Lymphadenopathy:     Cervical: No cervical adenopathy.  Skin:    General: Skin is warm and dry.     Findings: No rash.  Neurological:     Mental Status: She is alert and oriented to person, place, and time. Mental status is at baseline.  Psychiatric:        Mood  and Affect: Mood normal.        Behavior: Behavior normal.      No results found for any visits on 05/16/24.  Assessment & Plan     Problem List Items Addressed This Visit       Cardiovascular and Mediastinum   Essential hypertension - Primary   Hypertension is well-controlled on triamterene -HCTZ 37.5-25 mg daily with a blood pressure reading of 120/78. - Continue triamterene -HCTZ 37.5-25 mg daily. - Recheck blood pressure at next visit.      Relevant Medications   triamterene -hydrochlorothiazide (MAXZIDE-25) 37.5-25 MG tablet   Other Relevant Orders   Basic Metabolic Panel (BMET)     Respiratory   Allergic rhinitis due to pollen   Allergic rhinitis previously managed with levocetirizine, now with decreased effectiveness requiring additional medication at night. - Switch from levocetirizine to Allegra  once daily. - Cancel levocetirizine order with [facility] and ensure only Allegra  is sent.         Other   Anxiety   Anxiety disorder is managed with Buspar  7.5 mg BID with no new concerns or changes in symptoms. - Continue Buspar  7.5 mg BID. - Refill medications through [facility].      Relevant Medications   busPIRone  (BUSPAR ) 7.5 MG tablet   Psychophysiological insomnia   Insomnia likely related to caregiver stress and menopause, with reports of waking at 2:30 or 3:00 AM and inability to return to sleep. Good sleep hygiene practices are in place. - Recommend magnesium glycinate 400-600 mg, 30 minutes before bedtime to aid sleep. - Check potassium levels today due to blood pressure medication.      Other Visit Diagnoses       Immunization due        Relevant Orders   Pneumococcal conjugate vaccine 20-valent (Completed)       Return in about 6 months (around 11/13/2024) for CPE.       Jon Eva, MD  [facility] [phone] (phone) [phone] (fax)  [facility]

## 2024-05-17 LAB — BASIC METABOLIC PANEL WITH GFR
BUN/Creatinine Ratio: 26 (ref 12–28)
BUN: 19 mg/dL (ref 8–27)
CO2: 24 mmol/L (ref 20–29)
Calcium: 9.9 mg/dL (ref 8.7–10.3)
Chloride: 99 mmol/L (ref 96–106)
Creatinine, Ser: 0.74 mg/dL (ref 0.57–1.00)
Glucose: 77 mg/dL (ref 70–99)
Potassium: 4.2 mmol/L (ref 3.5–5.2)
Sodium: 139 mmol/L (ref 134–144)
eGFR: 92 mL/min/1.73 (ref >59)

## 2024-05-21 ENCOUNTER — Ambulatory Visit: Payer: Self-pay | Admitting: Family Medicine

## 2024-06-28 ENCOUNTER — Ambulatory Visit
Admission: RE | Admit: 2024-06-28 | Discharge: 2024-06-28 | Disposition: A | Discharge status: Home / Self Care | Source: Ambulatory Visit | Attending: Obstetrics and Gynecology | Admitting: Obstetrics and Gynecology

## 2024-06-28 DIAGNOSIS — Z1231 Encounter for screening mammogram for malignant neoplasm of breast: Secondary | ICD-10-CM

## 2024-07-04 ENCOUNTER — Encounter: Payer: Self-pay | Admitting: Family Medicine

## 2024-07-04 ENCOUNTER — Telehealth: Payer: Self-pay | Admitting: Family Medicine

## 2024-07-04 ENCOUNTER — Other Ambulatory Visit: Payer: Self-pay | Admitting: Obstetrics and Gynecology

## 2024-07-04 DIAGNOSIS — R928 Other abnormal and inconclusive findings on diagnostic imaging of breast: Secondary | ICD-10-CM

## 2024-07-04 NOTE — Telephone Encounter (Signed)
 Copied from CRM #[id number]. Topic: Clinical - Medication Refill >> Jul 04, 2024  1:27 PM Selinda RAMAN wrote: Medication: valACYclovir  (VALTREX ) 1000 MG  Has the patient contacted their pharmacy? No   This is the patient's preferred pharmacy:    [id number] [place], [place] - [address] AT [facility] OF [address] [place] [address] Phone: [phone] Fax: [phone]  Is this the correct pharmacy for this prescription? Yes If no, delete pharmacy and type the correct one.   Has the prescription been filled recently? No  Is the patient out of the medication? No but she only has 2 pills left. She is taking her last 1 1/2 right now.  Has the patient been seen for an appointment in the last year OR does the patient have an upcoming appointment? Yes  Can we respond through MyChart? Yes  The patient called in needing this refill because she gets these cold sores every once in a while and this medicine really helps if she uses it right away. She will try to upload a picture of her chin and put it on her my chart because she knows it was last prescribed by Dr Bertrum. Her chin is swollen and aching and you can see this is what is going on. Please assist patient further

## 2024-07-05 MED ORDER — VALACYCLOVIR HCL 1 G PO TABS
2000.0000 mg | ORAL_TABLET | Freq: Two times a day (BID) | ORAL | 2 refills | Status: AC
Start: 2024-07-05 — End: ?

## 2024-07-05 NOTE — Telephone Encounter (Signed)
 Rx sent.

## 2024-07-17 ENCOUNTER — Ambulatory Visit
Admission: RE | Admit: 2024-07-17 | Discharge: 2024-07-17 | Disposition: A | Source: Ambulatory Visit | Attending: Obstetrics and Gynecology

## 2024-07-17 ENCOUNTER — Ambulatory Visit
Admission: RE | Admit: 2024-07-17 | Discharge: 2024-07-17 | Disposition: A | Discharge status: Home / Self Care | Source: Ambulatory Visit | Attending: Obstetrics and Gynecology | Admitting: Obstetrics and Gynecology

## 2024-07-17 DIAGNOSIS — R928 Other abnormal and inconclusive findings on diagnostic imaging of breast: Secondary | ICD-10-CM

## 2024-08-28 ENCOUNTER — Other Ambulatory Visit: Payer: Self-pay

## 2024-08-28 DIAGNOSIS — Z1211 Encounter for screening for malignant neoplasm of colon: Secondary | ICD-10-CM

## 2024-09-10 ENCOUNTER — Ambulatory Visit: Payer: Self-pay | Admitting: Family Medicine

## 2024-09-10 ENCOUNTER — Other Ambulatory Visit (INDEPENDENT_AMBULATORY_CARE_PROVIDER_SITE_OTHER)

## 2024-09-10 DIAGNOSIS — Z1211 Encounter for screening for malignant neoplasm of colon: Secondary | ICD-10-CM

## 2024-09-10 LAB — IFOBT (OCCULT BLOOD): IFOBT: NEGATIVE

## 2024-09-19 ENCOUNTER — Encounter: Payer: Self-pay | Admitting: Obstetrics and Gynecology

## 2024-09-19 ENCOUNTER — Ambulatory Visit: Admitting: Obstetrics and Gynecology

## 2024-09-19 VITALS — BP 123/80 | HR 70 | Ht 70.0 in | Wt 147.0 lb

## 2024-09-19 DIAGNOSIS — N393 Stress incontinence (female) (male): Secondary | ICD-10-CM | POA: Diagnosis not present

## 2024-09-19 DIAGNOSIS — N3281 Overactive bladder: Secondary | ICD-10-CM | POA: Diagnosis not present

## 2024-09-19 MED ORDER — TROSPIUM CHLORIDE 20 MG PO TABS: 20.0000 mg | ORAL_TABLET | Freq: Two times a day (BID) | ORAL | 5 refills | Status: AC

## 2024-09-19 NOTE — Patient Instructions (Signed)

## 2024-09-19 NOTE — Assessment & Plan Note (Signed)
-   We discussed that based on her symptoms, her OAB seems like her more bothersome symptoms. Urethral bulking will not treat this.  - Recommended trying another medication or PTNS to see if she has improvement. She wants to try medication.

## 2024-09-19 NOTE — Assessment & Plan Note (Signed)
-   Reviewed options of repeat urethral bulking or a sling, which would be more permanent. She is considering both of these but wants to see how her symptoms after starting medication.

## 2024-09-19 NOTE — Progress Notes (Signed)
 [facility] Return Visit  SUBJECTIVE  History of Present Illness: Jill Robbins is a 64 y.o. female seen in follow-up for mixed incontinence. Last had urethral bulking April 2024.   After last visit, she tried myrbetriq for short time. But is did not work for her and she had some personal things going on and was overwhelmed.   She is using bathroom for 11-2 times per day. Gets an urge and cannot make it to the bathroom. She uses 3 pads per day but leaks more than that. She can have leakage without movement and sometimes without a warning.   She also leaks with cough/ sneeze or with blowing her nose at least once a day.   Drinks: 1 large cup of hot tea or coffee, 4 glasses of water  Denies any vaginal bulge or any constipation issues.   Past Medical History: Patient  has a past medical history of Anxiety, Arthritis, Atypical hyperplasia of left breast, Claustrophobia, Complication of anesthesia, Family history of adverse reaction to anesthesia, History of acne rosacea, HTN (hypertension), Osteopenia, and Succinylcholine adverse reaction.   Past Surgical History: She  has a past surgical history that includes Endometrial ablation; Rhinoplasty (1995); adnoids; Wisdom tooth extraction; sclero theray (09/21/2016); Labioplasty (Bilateral, 09/22/2017); Breast biopsy (Right, 12/08/2017); Breast biopsy (Left); Breast lumpectomy with radioactive seed localization (Left, 08/27/2020); LASIK (Bilateral); Cheilectomy (Right, 09/20/2023); and Cataract extraction.   Medications: She has a current medication list which includes the following prescription(s): buspirone , fexofenadine , veozah, glucosamine-chondroitin, magnesium glycinate, multivitamin with minerals, omega-3 fatty acids, miebo, triamterene -hydrochlorothiazide, trospium , and valacyclovir .   Allergies: Patient is allergic to succinylcholine, sulfa antibiotics, other, and peanut-containing drug products.   Social History: Patient   reports that she has never smoked. She has never used smokeless tobacco. She reports current alcohol use. She reports that she does not use drugs.     OBJECTIVE     Physical Exam: Vitals:   09/19/24 1008  BP: 123/80  Pulse: 70  Weight: 147 lb (66.7 kg)  Height: 5' 10 (1.778 m)   Gen: No apparent distress, A&O x 3.  Detailed Urogynecologic Evaluation:  Normal external genitalia. On speculum, normal vaginal mucosa and normal appearing cervix. On bimanual, uterus is small, mobile and nontender. No masses or prolapse present    ASSESSMENT AND PLAN    Jill Robbins is a 64 y.o. with:  1. Overactive bladder   2. SUI (stress urinary incontinence, female)     Overactive bladder Assessment & Plan: - We discussed that based on her symptoms, her OAB seems like her more bothersome symptoms. Urethral bulking will not treat this.  - Recommended trying another medication or PTNS to see if she has improvement. She wants to try medication.   Orders: -     Trospium  Chloride; Take 1 tablet (20 mg total) by mouth 2 (two) times daily.  Dispense: 60 tablet; Refill: 5  SUI (stress urinary incontinence, female) Assessment & Plan: - Reviewed options of repeat urethral bulking or a sling, which would be more permanent. She is considering both of these but wants to see how her symptoms after starting medication.    Return 6 weeks   Jill LOISE Caper, MD

## 2024-10-29 ENCOUNTER — Ambulatory Visit: Admitting: Obstetrics and Gynecology

## 2024-11-12 ENCOUNTER — Ambulatory Visit: Admitting: Obstetrics and Gynecology

## 2024-11-19 ENCOUNTER — Encounter: Admitting: Family Medicine
